# Patient Record
Sex: Female | Born: 1955
Health system: Southern US, Community
[De-identification: ages and names within clinical notes are randomized; demographics above are authoritative.]

## PROBLEM LIST (undated history)

## (undated) DIAGNOSIS — M8580 Other specified disorders of bone density and structure, unspecified site: Secondary | ICD-10-CM

## (undated) DIAGNOSIS — F329 Major depressive disorder, single episode, unspecified: Secondary | ICD-10-CM

## (undated) DIAGNOSIS — E7800 Pure hypercholesterolemia, unspecified: Secondary | ICD-10-CM

## (undated) DIAGNOSIS — I10 Essential (primary) hypertension: Secondary | ICD-10-CM

## (undated) DIAGNOSIS — Z9889 Other specified postprocedural states: Secondary | ICD-10-CM

## (undated) DIAGNOSIS — F419 Anxiety disorder, unspecified: Secondary | ICD-10-CM

## (undated) DIAGNOSIS — M1990 Unspecified osteoarthritis, unspecified site: Secondary | ICD-10-CM

## (undated) DIAGNOSIS — M419 Scoliosis, unspecified: Secondary | ICD-10-CM

## (undated) DIAGNOSIS — F32A Depression, unspecified: Secondary | ICD-10-CM

## (undated) DIAGNOSIS — E039 Hypothyroidism, unspecified: Secondary | ICD-10-CM

## (undated) DIAGNOSIS — R112 Nausea with vomiting, unspecified: Secondary | ICD-10-CM

---

## 1991-08-29 HISTORY — PX: KNEE ARTHROSCOPY: SUR90

## 1997-08-28 HISTORY — PX: TUBAL LIGATION: SHX77

## 1997-08-28 HISTORY — PX: BREAST ENHANCEMENT SURGERY: SHX7

## 1997-08-28 HISTORY — PX: OOPHORECTOMY: SHX86

## 1997-08-28 HISTORY — PX: AUGMENTATION MAMMAPLASTY: SUR837

## 1998-08-28 HISTORY — PX: TOTAL HIP ARTHROPLASTY: SHX124

## 1998-10-21 ENCOUNTER — Other Ambulatory Visit: Admission: RE | Admit: 1998-10-21 | Discharge: 1998-10-21 | Payer: Self-pay | Admitting: Obstetrics & Gynecology

## 1999-06-29 ENCOUNTER — Encounter: Payer: Self-pay | Admitting: Orthopedic Surgery

## 1999-07-06 ENCOUNTER — Inpatient Hospital Stay (HOSPITAL_COMMUNITY): Admission: RE | Admit: 1999-07-06 | Discharge: 1999-07-09 | Payer: Self-pay | Admitting: Orthopedic Surgery

## 1999-07-06 ENCOUNTER — Encounter: Payer: Self-pay | Admitting: Orthopedic Surgery

## 1999-11-07 ENCOUNTER — Other Ambulatory Visit: Admission: RE | Admit: 1999-11-07 | Discharge: 1999-11-07 | Payer: Self-pay | Admitting: Obstetrics & Gynecology

## 2000-12-12 ENCOUNTER — Other Ambulatory Visit: Admission: RE | Admit: 2000-12-12 | Discharge: 2000-12-12 | Payer: Self-pay | Admitting: Obstetrics & Gynecology

## 2001-12-23 ENCOUNTER — Other Ambulatory Visit: Admission: RE | Admit: 2001-12-23 | Discharge: 2001-12-23 | Payer: Self-pay | Admitting: Obstetrics & Gynecology

## 2002-12-26 ENCOUNTER — Other Ambulatory Visit: Admission: RE | Admit: 2002-12-26 | Discharge: 2002-12-26 | Payer: Self-pay | Admitting: Obstetrics & Gynecology

## 2003-12-14 ENCOUNTER — Other Ambulatory Visit: Admission: RE | Admit: 2003-12-14 | Discharge: 2003-12-14 | Payer: Self-pay | Admitting: Obstetrics & Gynecology

## 2004-08-28 HISTORY — PX: ABDOMINOPLASTY: SUR9

## 2008-01-23 ENCOUNTER — Encounter: Admission: RE | Admit: 2008-01-23 | Discharge: 2008-01-23 | Payer: Self-pay | Admitting: Orthopedic Surgery

## 2008-04-25 ENCOUNTER — Inpatient Hospital Stay (HOSPITAL_COMMUNITY): Admission: AC | Admit: 2008-04-25 | Discharge: 2008-04-28 | Payer: Self-pay

## 2008-04-26 ENCOUNTER — Encounter (INDEPENDENT_AMBULATORY_CARE_PROVIDER_SITE_OTHER): Payer: Self-pay | Admitting: General Surgery

## 2008-08-28 HISTORY — PX: TOTAL HIP ARTHROPLASTY: SHX124

## 2008-09-24 ENCOUNTER — Encounter: Admission: RE | Admit: 2008-09-24 | Discharge: 2008-09-24 | Payer: Self-pay | Admitting: Orthopedic Surgery

## 2009-01-06 ENCOUNTER — Encounter: Admission: RE | Admit: 2009-01-06 | Discharge: 2009-01-06 | Payer: Self-pay | Admitting: Orthopedic Surgery

## 2009-05-07 ENCOUNTER — Inpatient Hospital Stay (HOSPITAL_COMMUNITY): Admission: RE | Admit: 2009-05-07 | Discharge: 2009-05-10 | Payer: Self-pay | Admitting: Orthopedic Surgery

## 2010-08-28 HISTORY — PX: TOTAL SHOULDER ARTHROPLASTY: SHX126

## 2010-12-02 LAB — CBC
HCT: 21 % — ABNORMAL LOW (ref 36.0–46.0)
HCT: 23.2 % — ABNORMAL LOW (ref 36.0–46.0)
HCT: 27.9 % — ABNORMAL LOW (ref 36.0–46.0)
HCT: 40.3 % (ref 36.0–46.0)
Hemoglobin: 13.8 g/dL (ref 12.0–15.0)
Hemoglobin: 7.5 g/dL — CL (ref 12.0–15.0)
Hemoglobin: 8.5 g/dL — ABNORMAL LOW (ref 12.0–15.0)
Hemoglobin: 9.8 g/dL — ABNORMAL LOW (ref 12.0–15.0)
MCHC: 35.1 g/dL (ref 30.0–36.0)
MCHC: 35.6 g/dL (ref 30.0–36.0)
MCV: 100 fL (ref 78.0–100.0)
MCV: 99.7 fL (ref 78.0–100.0)
RBC: 2.34 MIL/uL — ABNORMAL LOW (ref 3.87–5.11)
RBC: 2.8 MIL/uL — ABNORMAL LOW (ref 3.87–5.11)
RBC: 4.03 MIL/uL (ref 3.87–5.11)
RDW: 12.8 % (ref 11.5–15.5)
RDW: 13 % (ref 11.5–15.5)
WBC: 4 10*3/uL (ref 4.0–10.5)
WBC: 7.3 10*3/uL (ref 4.0–10.5)

## 2010-12-02 LAB — CARDIAC PANEL(CRET KIN+CKTOT+MB+TROPI)
Relative Index: 0.8 (ref 0.0–2.5)
Total CK: 266 U/L — ABNORMAL HIGH (ref 7–177)

## 2010-12-02 LAB — TYPE AND SCREEN: Antibody Screen: NEGATIVE

## 2010-12-02 LAB — COMPREHENSIVE METABOLIC PANEL
AST: 31 U/L (ref 0–37)
BUN: 18 mg/dL (ref 6–23)
CO2: 32 mEq/L (ref 19–32)
Chloride: 102 mEq/L (ref 96–112)
Creatinine, Ser: 0.96 mg/dL (ref 0.4–1.2)
GFR calc non Af Amer: >60 mL/min (ref >60)
Total Bilirubin: 0.5 mg/dL (ref 0.3–1.2)

## 2010-12-02 LAB — URINALYSIS, ROUTINE W REFLEX MICROSCOPIC
Bilirubin Urine: NEGATIVE
Hgb urine dipstick: NEGATIVE
Specific Gravity, Urine: 1.018 (ref 1.005–1.030)
Urobilinogen, UA: 0.2 mg/dL (ref 0.0–1.0)

## 2010-12-02 LAB — BASIC METABOLIC PANEL
BUN: 9 mg/dL (ref 6–23)
CO2: 29 mEq/L (ref 19–32)
CO2: 30 mEq/L (ref 19–32)
Chloride: 100 mEq/L (ref 96–112)
Chloride: 101 mEq/L (ref 96–112)
Creatinine, Ser: 0.7 mg/dL (ref 0.4–1.2)
GFR calc Af Amer: >60 mL/min (ref >60)
Glucose, Bld: 119 mg/dL — ABNORMAL HIGH (ref 70–99)
Potassium: 3.5 mEq/L (ref 3.5–5.1)
Potassium: 4.5 mEq/L (ref 3.5–5.1)
Potassium: 4.7 mEq/L (ref 3.5–5.1)
Sodium: 137 mEq/L (ref 135–145)
Sodium: 138 mEq/L (ref 135–145)

## 2010-12-02 LAB — APTT: aPTT: 43 seconds — ABNORMAL HIGH (ref 24–37)
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
## 2011-04-20 ENCOUNTER — Other Ambulatory Visit (HOSPITAL_COMMUNITY): Payer: Self-pay

## 2011-04-25 ENCOUNTER — Other Ambulatory Visit (HOSPITAL_COMMUNITY): Payer: BC Managed Care – PPO

## 2011-05-04 ENCOUNTER — Ambulatory Visit (HOSPITAL_COMMUNITY)
Admission: RE | Admit: 2011-05-04 | Payer: BC Managed Care – PPO | Source: Ambulatory Visit | Admitting: Orthopedic Surgery

## 2011-05-08 ENCOUNTER — Other Ambulatory Visit: Payer: Self-pay | Admitting: Gastroenterology

## 2011-05-08 DIAGNOSIS — R1013 Epigastric pain: Secondary | ICD-10-CM

## 2011-05-09 ENCOUNTER — Other Ambulatory Visit (HOSPITAL_COMMUNITY): Payer: BC Managed Care – PPO

## 2011-05-11 ENCOUNTER — Ambulatory Visit
Admission: RE | Admit: 2011-05-11 | Discharge: 2011-05-11 | Disposition: A | Discharge status: Home / Self Care | Payer: BC Managed Care – PPO | Source: Ambulatory Visit | Attending: Gastroenterology | Admitting: Gastroenterology

## 2011-05-11 DIAGNOSIS — R1013 Epigastric pain: Secondary | ICD-10-CM

## 2011-05-12 ENCOUNTER — Other Ambulatory Visit: Payer: BC Managed Care – PPO

## 2011-05-12 ENCOUNTER — Encounter (HOSPITAL_BASED_OUTPATIENT_CLINIC_OR_DEPARTMENT_OTHER)
Admission: RE | Admit: 2011-05-12 | Discharge: 2011-05-12 | Disposition: A | Discharge status: Home / Self Care | Payer: BC Managed Care – PPO | Source: Ambulatory Visit | Attending: Orthopedic Surgery | Admitting: Orthopedic Surgery

## 2011-05-12 LAB — BASIC METABOLIC PANEL
CO2: 30 mEq/L (ref 19–32)
GFR calc non Af Amer: >60 mL/min (ref >60)
Glucose, Bld: 129 mg/dL — ABNORMAL HIGH (ref 70–99)
Potassium: 4 mEq/L (ref 3.5–5.1)
Sodium: 141 mEq/L (ref 135–145)

## 2011-05-16 ENCOUNTER — Inpatient Hospital Stay (HOSPITAL_COMMUNITY)
Admission: RE | Admit: 2011-05-16 | Payer: BC Managed Care – PPO | Source: Ambulatory Visit | Admitting: Orthopedic Surgery

## 2011-05-16 ENCOUNTER — Ambulatory Visit (HOSPITAL_BASED_OUTPATIENT_CLINIC_OR_DEPARTMENT_OTHER)
Admission: RE | Admit: 2011-05-16 | Discharge: 2011-05-17 | Disposition: A | Discharge status: Home / Self Care | Payer: BC Managed Care – PPO | Source: Ambulatory Visit | Attending: Orthopedic Surgery | Admitting: Orthopedic Surgery

## 2011-05-16 DIAGNOSIS — M66329 Spontaneous rupture of flexor tendons, unspecified upper arm: Secondary | ICD-10-CM | POA: Insufficient documentation

## 2011-05-16 DIAGNOSIS — Z01812 Encounter for preprocedural laboratory examination: Secondary | ICD-10-CM | POA: Insufficient documentation

## 2011-05-16 DIAGNOSIS — M24019 Loose body in unspecified shoulder: Secondary | ICD-10-CM | POA: Insufficient documentation

## 2011-05-16 DIAGNOSIS — M19019 Primary osteoarthritis, unspecified shoulder: Secondary | ICD-10-CM | POA: Insufficient documentation

## 2011-05-16 LAB — POCT HEMOGLOBIN-HEMACUE: Hemoglobin: 13.8 g/dL (ref 12.0–15.0)

## 2011-05-17 LAB — CBC
MCV: 97.2 fL (ref 78.0–100.0)
Platelets: 284 10*3/uL (ref 150–400)
RBC: 3.52 MIL/uL — ABNORMAL LOW (ref 3.87–5.11)
RDW: 12.2 % (ref 11.5–15.5)
WBC: 9 10*3/uL (ref 4.0–10.5)

## 2011-05-18 NOTE — Op Note (Signed)
NAMEMarland Kitchen  PERRIN, EDDLEMAN               ACCOUNT NO.:  [id number]  MEDICAL RECORD NO.:  [id number]  LOCATION:                                 FACILITY:  PHYSICIAN:  Katy Fitch. Raziya Aveni, M.D. DATE OF BIRTH:  23-Jul-1956  DATE OF PROCEDURE:  05/16/2011 DATE OF DISCHARGE:                              OPERATIVE REPORT   PREOPERATIVE DIAGNOSES:  Severely painful end-stage glenohumeral degenerative arthritis right shoulder with bone-on-bone arthropathy and profound flattening and teardrop osteophyte deformity of humeral head and multiple loose bodies within glenohumeral joint capsule, also significant tendinopathy of long head of biceps.  OPERATION: 1. Right shoulder glenohumeral implant arthroplasty utilizing a Biomet     comprehensive 7-mm mini stem Press-Fit, a 17 x 44 mm offset head     with the 4 position anteriorly and a 4-mm keeled inset glenoid     cemented. 2. Tenodesis (subpectoral) long head right biceps.  OPERATING SURGEON:  Katy Fitch. Hadlee Burback, MD  ASSISTANT:  Marveen Reeks Dasnoit, PA-C  ANESTHESIA:  General endotracheal supplemented by a right plexus block.  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  She presented for evaluation of her right shoulder in June 2012.  She reported that she has had severe bilateral shoulder arthritis and bilateral hip arthritis and is status post bilateral total hip arthroplasty by Dr. Lequita Halt.  She saw Dr. Darrelyn Hillock for consult regarding her shoulder and was advised to pursue a therapy rehabilitation program.  She sought an alternative opinion with our office and upon examination was noted have marked restriction of range of motion, elevation, external rotation, and could not reach behind her back.  She had night pain that limited her ability to sleep.  She could not dress herself comfortably.  She was having difficulty trying to  exercise due to significant bilateral shoulder pain.  Plain x-rays of her shoulder and an MRI from [facility] documented profound bone-on-bone arthropathy of the glenohumeral joint, flattening the humeral head, development of anterior, inferior, and posterior marginal osteophytes, and complete loss of the normal architecture of the glenoid.  There was, however, an excellent glenoid vault and ample bone stock for a glenoid component.  I had a detailed informed consent with Paula Potts in which I stated that at her age of 55 she would be at risk for developing possible loosening of her glenoid as she likely had another 30-year lifespan and we discussed the entire spectrum of potential complications of implant arthroplasty, the most worrisome including infection, loosening of the glenoid component, metal intolerance, possible neurovascular injury during surgery, possible axillary nerve weakness due to retraction, and the possibility that she might require late repair of rotator cuff or other shoulder treatment.  The benefits would include pain relief, improved range of motion, improved ability for self-care, and hopefully improved ability to sleep, although she may need to address her left shoulder prior to obtaining sleep comfort.  We exhaustively discussed the risks of infection.  We discussed providing [facility].  Paula Potts was adamant that she would prefer  to have surgery at the [facility] as she understood that the rate of possible nosocomial infection was lower per reports of the [facility].  We have had extensive experience providing shoulder arthroplasty and revision at the [facility] during the past several years and have a very experienced surgical team.  After informed consent, she is scheduled for implant arthroplasty of her right shoulder at this time.  Preoperatively,  she was interviewed by Dr. Gypsy Balsam.  She reported that she was quite intolerant of narcotics, specifically morphine and codeine. She stated that she had refractory nausea and vomiting after provision of narcotics for hip surgeries.  Dr. Gypsy Balsam provided a small dose of fentanyl in preparation for placement of a plexus block preoperatively. Paula Potts had refractory vomiting and retching after exposure to the narcotic which required IV steroids and IV Zofran to break.  We offered postponing surgery to another date, however, she was resolute in that she wanted to proceed at this time and  had dealt with this predicament previously with other elective surgeries.  She did state that postoperatively she would be able to tolerate Percocet.  Dr. Gypsy Balsam placed a Marcaine block for maximum comfort.  PROCEDURE:  Paula Potts was brought to room #2 at the [facility] and placed in supine position on the operating table.  Following anesthesia and informed consent by Dr. Gypsy Balsam, general anesthesia by an endotracheal technique was induced followed by careful position into beach-chair position with aide of a torso and head holder designed for shoulder arthroscopy.  Examination of the shoulder under anesthesia revealed combined elevation 110 degrees, forward flexion 110 degrees, external rotation at 90 degrees, abduction of 30 degrees, and external rotation at neutral of only 15 degrees.  She had marked humeral head deformity and marked stiffness of the shoulder.  The entire right upper extremity and forequarter were prepped with DuraPrep and draped with impervious arthroscopy drapes.  Passive compression devices were applied for deep vein thrombosis protection on her calves and a Foley catheter was placed.  After a routine surgical time-out, the procedure commenced with a 12-cm incision from the clavicle across the lateral aspect of the coracoid to the axillary fold.  Subcutaneous tissues  were carefully divided taking care to identify the deltopectoral interval.  The cephalic vein was identified.  A large venous plexus to the deltoid was suture ligated and the cephalic vein was retracted medially.  The clavipectoral fascia was identified and marked deformity of the humeral head palpated through the capsule.  The long head of the biceps was identified and intertubercular ligament was released with scissors followed by anatomic tenodesis of the long head of the biceps to the pectoralis major with multiple grasping sutures of #2 FiberWire.  The stump of the biceps was resected followed by release of the rotator interval and meticulous elevation of the subscap off the lesser tuberosity with a sharp osteotome and micro- tenotomy scissors.  Due to the marked deformity of the humeral head, we carefully palpated the inferior capsule and meticulously over 45 minutes resected the marginal osteophytes off the humeral head so as to avoid possible traction on the axillary nerve with vigorous retraction in the axillary region.  All of our dissection was performed intracapsularly.  The inferior  circumflex vessels were identified and electrocauterized prior to capsular release.  Once the head was properly contoured with the aid of the Biomet jigs, we placed the reamer in the intermaxillary canal and utilized the jigs to obtain a  proper 45-degree angle resection with a 35- degree retroversion.  Once this was completed, there was still some osteophyte posteriorly.  Therefore, we used the oscillating saw with freehand technique to remove slightly more head clearing the osteophytes.  A meticulous debridement of the capsule revealed multiple loose bodies inferiorly and several adherent marginal osteophytes in the inferior capsule.  Once again, care was taken to protect the axillary nerve throughout dissection.  The humeral head was retracted with a Fukuda retractor followed by exposure of  the glenoid by synovectomy, release of the capsular ligaments with cutting cautery, resection of the biceps stump, and placement of a Cobra retractor anteriorly and a Fukuda retractor posteriorly.  The glenoid was eburnated with areas of ivory-type bone and some blistering of the marginal remnants of cartilage.  We trialed the smallest 4-mm polyethylene glenoid and found that we would have a very fine rim of bone with an inset technique.  Subsequently, with the aid of a power bur and a meticulous dissection over 30 minutes, we machined the glenoid to accept a 4 mm at an inset of approximately 3 mm and cut the keel space with the power reamer and finished it with the hand rasp.  Multiple cement holes were drilled peripherally with the bur followed by trial inset of the glenoid component.  We were able to seat this flush preserving all the capsular ligaments.  We subsequently prepared cement, changed gloves, meticulously irrigated the glenoid, and with the cement in a semiliquid state, compressed cement into the keel space as well as the negative space in the glenoid and press fit the glenoid component 4 mm small into the glenoid removing excess cement. Very satisfactory inset was achieved with the polyethylene no more than 1 mm proud of the native glenoid bone rim.  The bone was quite thin anteriorly and posteriorly due to her diminutive size, however, excellent inset was achieved with an intact soft tissue envelope.  The capsular ligamentous structures should be anatomic postoperatively.  After thorough irrigation and waiting 15 minutes for the cement to set, we then prepared the proximal humerus with sequential broaching to 7 mm trialing with a 44 x 70 mm head ultimately choosing offset head long axis posterior.  After changing gloves the second time with no-touch technique, we placed the permanent stem 7-mm mini stem, tamped it in place with proper version, irrigated the reverse Morse  taper, cleaned it, and placed a 44 x 17 mm offset head with a #4 position anteriorly and the long axis posteriorly.  Anatomic coverage of the head was achieved.  The joint was reduced followed by anatomic repair of the subscapularis and rotator interval with through-bone sutures of #2 FiberWire and a finishing baseball stitch of #2 FiberWire.  The wound was meticulously irrigated followed by placement of a medium Hemovac drain brought out through a stab wound to the lateral brachium. The subcutaneous tissues were repaired with 0 Vicryl.  The skin was repaired with subcutaneous 3-0 Vicryl and intradermal 3-0 Prolene with Steri-Strips.  Total blood loss was approximately 100 mL.  Fluid replacement, see anesthesia sheet.  Paula Potts tolerated the surgery and anesthesia well.  She was placed in a sling and transferred to the recovery room with stable  vital signs.  Note that she will be [facility] for a 24-hour observation and if we have difficulties with hydration to her vomiting may admit her to the hospital for further IV hydration on a p.r.n. basis.  Katy Fitch Jaquilla Woodroof, M.D.     RVS/MEDQ  D:  05/16/2011  T:  05/16/2011  Job:  [id number]  cc:   Gretta Arab. Valentina Lucks, M.D.  Electronically Signed by Josephine Igo M.D. on 05/18/2011 11:46:05 AM

## 2011-06-07 ENCOUNTER — Emergency Department (HOSPITAL_COMMUNITY)
Admission: EM | Admit: 2011-06-07 | Discharge: 2011-06-07 | Disposition: A | Discharge status: Home / Self Care | Payer: BC Managed Care – PPO | Attending: Emergency Medicine | Admitting: Emergency Medicine

## 2011-06-07 DIAGNOSIS — Z79899 Other long term (current) drug therapy: Secondary | ICD-10-CM | POA: Insufficient documentation

## 2011-06-07 DIAGNOSIS — M25539 Pain in unspecified wrist: Secondary | ICD-10-CM | POA: Insufficient documentation

## 2011-06-07 DIAGNOSIS — I10 Essential (primary) hypertension: Secondary | ICD-10-CM | POA: Insufficient documentation

## 2011-06-07 DIAGNOSIS — S61509A Unspecified open wound of unspecified wrist, initial encounter: Secondary | ICD-10-CM | POA: Insufficient documentation

## 2011-06-07 DIAGNOSIS — Y92009 Unspecified place in unspecified non-institutional (private) residence as the place of occurrence of the external cause: Secondary | ICD-10-CM | POA: Insufficient documentation

## 2011-06-07 DIAGNOSIS — W268XXA Contact with other sharp object(s), not elsewhere classified, initial encounter: Secondary | ICD-10-CM | POA: Insufficient documentation

## 2011-06-07 DIAGNOSIS — M1990 Unspecified osteoarthritis, unspecified site: Secondary | ICD-10-CM | POA: Insufficient documentation

## 2011-06-14 ENCOUNTER — Emergency Department (HOSPITAL_COMMUNITY)
Admission: EM | Admit: 2011-06-14 | Discharge: 2011-06-14 | Disposition: A | Discharge status: Home / Self Care | Payer: BC Managed Care – PPO | Attending: Emergency Medicine | Admitting: Emergency Medicine

## 2011-06-14 DIAGNOSIS — Z4802 Encounter for removal of sutures: Secondary | ICD-10-CM | POA: Insufficient documentation

## 2012-04-04 ENCOUNTER — Other Ambulatory Visit: Payer: Self-pay

## 2012-05-07 ENCOUNTER — Other Ambulatory Visit: Payer: Self-pay | Admitting: Orthopedic Surgery

## 2012-05-07 ENCOUNTER — Encounter (HOSPITAL_BASED_OUTPATIENT_CLINIC_OR_DEPARTMENT_OTHER): Payer: Self-pay | Admitting: *Deleted
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
## 2012-05-10 ENCOUNTER — Encounter (HOSPITAL_BASED_OUTPATIENT_CLINIC_OR_DEPARTMENT_OTHER)
Admission: RE | Admit: 2012-05-10 | Discharge: 2012-05-10 | Disposition: A | Discharge status: Home / Self Care | Payer: BC Managed Care – PPO | Source: Ambulatory Visit | Attending: Orthopedic Surgery | Admitting: Orthopedic Surgery

## 2012-05-10 LAB — BASIC METABOLIC PANEL
CO2: 27 mEq/L (ref 19–32)
Chloride: 101 mEq/L (ref 96–112)
Creatinine, Ser: 0.8 mg/dL (ref 0.50–1.10)
GFR calc Af Amer: >90 mL/min (ref >90)
Potassium: 4.3 mEq/L (ref 3.5–5.1)
Sodium: 138 mEq/L (ref 135–145)

## 2012-05-14 ENCOUNTER — Ambulatory Visit (HOSPITAL_BASED_OUTPATIENT_CLINIC_OR_DEPARTMENT_OTHER): Payer: BC Managed Care – PPO | Admitting: Certified Registered Nurse Anesthetist

## 2012-05-14 ENCOUNTER — Encounter (HOSPITAL_BASED_OUTPATIENT_CLINIC_OR_DEPARTMENT_OTHER): Payer: Self-pay | Admitting: Certified Registered Nurse Anesthetist

## 2012-05-14 ENCOUNTER — Encounter (HOSPITAL_BASED_OUTPATIENT_CLINIC_OR_DEPARTMENT_OTHER): Payer: Self-pay

## 2012-05-14 ENCOUNTER — Ambulatory Visit (HOSPITAL_BASED_OUTPATIENT_CLINIC_OR_DEPARTMENT_OTHER)
Admission: RE | Admit: 2012-05-14 | Discharge: 2012-05-15 | Disposition: A | Discharge status: Home / Self Care | Payer: BC Managed Care – PPO | Source: Ambulatory Visit | Attending: Orthopedic Surgery | Admitting: Orthopedic Surgery

## 2012-05-14 ENCOUNTER — Encounter (HOSPITAL_BASED_OUTPATIENT_CLINIC_OR_DEPARTMENT_OTHER)
Admission: RE | Disposition: A | Discharge status: Home / Self Care | Payer: Self-pay | Source: Ambulatory Visit | Attending: Orthopedic Surgery

## 2012-05-14 DIAGNOSIS — M19019 Primary osteoarthritis, unspecified shoulder: Secondary | ICD-10-CM | POA: Insufficient documentation

## 2012-05-14 DIAGNOSIS — I10 Essential (primary) hypertension: Secondary | ICD-10-CM | POA: Insufficient documentation

## 2012-05-14 DIAGNOSIS — Z01812 Encounter for preprocedural laboratory examination: Secondary | ICD-10-CM | POA: Insufficient documentation

## 2012-05-14 DIAGNOSIS — Z01810 Encounter for preprocedural cardiovascular examination: Secondary | ICD-10-CM | POA: Insufficient documentation

## 2012-05-14 HISTORY — PX: TOTAL SHOULDER ARTHROPLASTY: SHX126

## 2012-05-14 HISTORY — DX: Essential (primary) hypertension: I10

## 2012-05-14 HISTORY — DX: Other specified postprocedural states: Z98.890

## 2012-05-14 HISTORY — DX: Unspecified osteoarthritis, unspecified site: M19.90

## 2012-05-14 HISTORY — DX: Scoliosis, unspecified: M41.9

## 2012-05-14 HISTORY — DX: Other specified postprocedural states: R11.2

## 2012-05-14 SURGERY — ARTHROPLASTY, SHOULDER, TOTAL
Anesthesia: General | Site: Shoulder | Laterality: Left | Wound class: Clean

## 2012-05-14 MED ORDER — MIDAZOLAM HCL 2 MG/2ML IJ SOLN
0.5000 mg | INTRAMUSCULAR | Status: DC | PRN
  Administered 2012-05-14: 2 mg via INTRAVENOUS

## 2012-05-14 MED ORDER — MIDAZOLAM HCL 5 MG/5ML IJ SOLN
INTRAMUSCULAR | Status: DC | PRN
  Administered 2012-05-14: 1 mg via INTRAVENOUS

## 2012-05-14 MED ORDER — CHLORHEXIDINE GLUCONATE 4 % EX LIQD: 60.0000 mL | Freq: Once | CUTANEOUS | Status: DC

## 2012-05-14 MED ORDER — VANCOMYCIN HCL 1000 MG IV SOLR
1000.0000 mg | INTRAVENOUS | Status: DC | PRN
  Administered 2012-05-14: 1000 mg via INTRAVENOUS

## 2012-05-14 MED ORDER — EPHEDRINE SULFATE 50 MG/ML IJ SOLN
INTRAMUSCULAR | Status: DC | PRN
  Administered 2012-05-14: 10 mg via INTRAVENOUS

## 2012-05-14 MED ORDER — ONDANSETRON HCL 4 MG/2ML IJ SOLN
4.0000 mg | Freq: Four times a day (QID) | INTRAMUSCULAR | Status: DC | PRN
  Administered 2012-05-15: 4 mg via INTRAVENOUS

## 2012-05-14 MED ORDER — ONDANSETRON HCL 4 MG/2ML IJ SOLN
INTRAMUSCULAR | Status: DC | PRN
  Administered 2012-05-14 (×2): 4 mg via INTRAVENOUS

## 2012-05-14 MED ORDER — CEFAZOLIN SODIUM-DEXTROSE 2-3 GM-% IV SOLR: 2.0000 g | Freq: Four times a day (QID) | INTRAVENOUS | Status: DC

## 2012-05-14 MED ORDER — GLYCOPYRROLATE 0.2 MG/ML IJ SOLN
INTRAMUSCULAR | Status: DC | PRN
  Administered 2012-05-14: 0.2 mg via INTRAVENOUS

## 2012-05-14 MED ORDER — LIDOCAINE HCL (CARDIAC) 20 MG/ML IV SOLN
INTRAVENOUS | Status: DC | PRN
  Administered 2012-05-14: 50 mg via INTRAVENOUS

## 2012-05-14 MED ORDER — METHOCARBAMOL 500 MG PO TABS
500.0000 mg | ORAL_TABLET | Freq: Three times a day (TID) | ORAL | Status: DC | PRN
  Administered 2012-05-14 – 2012-05-15 (×3): 500 mg via ORAL

## 2012-05-14 MED ORDER — SUCCINYLCHOLINE CHLORIDE 20 MG/ML IJ SOLN
INTRAMUSCULAR | Status: DC | PRN
  Administered 2012-05-14: 100 mg via INTRAVENOUS

## 2012-05-14 MED ORDER — HYDROMORPHONE HCL 2 MG PO TABS
2.0000 mg | ORAL_TABLET | ORAL | Status: DC | PRN
  Administered 2012-05-14 – 2012-05-15 (×3): 2 mg via ORAL

## 2012-05-14 MED ORDER — FENTANYL CITRATE 0.05 MG/ML IJ SOLN: 50.0000 ug | INTRAMUSCULAR | Status: DC | PRN

## 2012-05-14 MED ORDER — SODIUM CHLORIDE 0.9 % IV SOLN
INTRAVENOUS | Status: DC
  Administered 2012-05-14: 20 mL/h via INTRAVENOUS

## 2012-05-14 MED ORDER — PROPOFOL 10 MG/ML IV BOLUS
INTRAVENOUS | Status: DC | PRN
  Administered 2012-05-14: 200 mg via INTRAVENOUS

## 2012-05-14 MED ORDER — METOCLOPRAMIDE HCL 5 MG/ML IJ SOLN: 5.0000 mg | Freq: Three times a day (TID) | INTRAMUSCULAR | Status: DC | PRN

## 2012-05-14 MED ORDER — SCOPOLAMINE 1 MG/3DAYS TD PT72
1.0000 | MEDICATED_PATCH | TRANSDERMAL | Status: DC
  Administered 2012-05-14: 1.5 mg via TRANSDERMAL

## 2012-05-14 MED ORDER — ACETAMINOPHEN 10 MG/ML IV SOLN
1000.0000 mg | Freq: Once | INTRAVENOUS | Status: AC
  Administered 2012-05-14: 1000 mg via INTRAVENOUS

## 2012-05-14 MED ORDER — PROMETHAZINE HCL 25 MG/ML IJ SOLN
6.2500 mg | Freq: Once | INTRAMUSCULAR | Status: AC
  Administered 2012-05-14: 6.25 mg via INTRAVENOUS

## 2012-05-14 MED ORDER — HYDROMORPHONE HCL PF 1 MG/ML IJ SOLN
0.5000 mg | INTRAMUSCULAR | Status: DC | PRN
  Administered 2012-05-14 – 2012-05-15 (×4): 1 mg via INTRAVENOUS

## 2012-05-14 MED ORDER — HYDROMORPHONE HCL PF 1 MG/ML IJ SOLN: 0.2500 mg | INTRAMUSCULAR | Status: DC | PRN

## 2012-05-14 MED ORDER — ONDANSETRON HCL 4 MG PO TABS: 4.0000 mg | ORAL_TABLET | Freq: Four times a day (QID) | ORAL | Status: DC | PRN

## 2012-05-14 MED ORDER — PROMETHAZINE HCL 25 MG/ML IJ SOLN
12.5000 mg | Freq: Four times a day (QID) | INTRAMUSCULAR | Status: DC | PRN
  Administered 2012-05-14 – 2012-05-15 (×2): 12.5 mg via INTRAVENOUS

## 2012-05-14 MED ORDER — METOCLOPRAMIDE HCL 5 MG PO TABS: 5.0000 mg | ORAL_TABLET | Freq: Three times a day (TID) | ORAL | Status: DC | PRN

## 2012-05-14 MED ORDER — CEFAZOLIN SODIUM-DEXTROSE 2-3 GM-% IV SOLR
2.0000 g | Freq: Four times a day (QID) | INTRAVENOUS | Status: AC
  Administered 2012-05-14 (×2): 2 g via INTRAVENOUS

## 2012-05-14 MED ORDER — LACTATED RINGERS IV SOLN
INTRAVENOUS | Status: DC
  Administered 2012-05-14 (×3): via INTRAVENOUS

## 2012-05-14 MED ORDER — DEXAMETHASONE SODIUM PHOSPHATE 4 MG/ML IJ SOLN
INTRAMUSCULAR | Status: DC | PRN
  Administered 2012-05-14: 10 mg via INTRAVENOUS

## 2012-05-14 MED ORDER — ROPIVACAINE HCL 5 MG/ML IJ SOLN
INTRAMUSCULAR | Status: DC | PRN
  Administered 2012-05-14: 30 mL via EPIDURAL

## 2012-05-14 MED ORDER — CEFAZOLIN SODIUM-DEXTROSE 2-3 GM-% IV SOLR
2.0000 g | Freq: Once | INTRAVENOUS | Status: AC
  Administered 2012-05-14: 2 g via INTRAVENOUS

## 2012-05-14 SURGICAL SUPPLY — 87 items
APPLIER CLIP 11 MED OPEN (CLIP)
BLADE HEX COATED 2.75 (ELECTRODE) ×2 IMPLANT
BLADE SAW SAG 73X25 THK (BLADE) ×1
BLADE SAW SGTL 73X25 THK (BLADE) ×1 IMPLANT
BLADE SURG 15 STRL LF DISP TIS (BLADE) ×2 IMPLANT
BLADE SURG 15 STRL SS (BLADE) ×2
BOWL SMART MIX CTS (DISPOSABLE) ×2 IMPLANT
BUR STRYKR EGG 5.0 (BURR) ×2 IMPLANT
BUR SURG 4X8 MED (BURR) ×1 IMPLANT
BURR SURG 4X8 MED (BURR) ×2
CANISTER SUCTION 2500CC (MISCELLANEOUS) ×2 IMPLANT
CEMENT BONE SIMPLEX SPEEDSET (Cement) ×2 IMPLANT
CLEANER CAUTERY TIP 5X5 PAD (MISCELLANEOUS) IMPLANT
CLIP APPLIE 11 MED OPEN (CLIP) IMPLANT
CLOTH BEACON ORANGE TIMEOUT ST (SAFETY) ×2 IMPLANT
CORDS BIPOLAR (ELECTRODE) IMPLANT
COVER MAYO STAND STRL (DRAPES) ×4 IMPLANT
COVER TABLE BACK 60X90 (DRAPES) ×2 IMPLANT
DECANTER SPIKE VIAL GLASS SM (MISCELLANEOUS) IMPLANT
DRAPE INCISE IOBAN 66X45 STRL (DRAPES) ×2 IMPLANT
DRAPE SURG 17X23 STRL (DRAPES) ×2 IMPLANT
DRAPE U-SHAPE 47X51 STRL (DRAPES) ×2 IMPLANT
DRAPE U-SHAPE 76X120 STRL (DRAPES) ×4 IMPLANT
DRAPE UTILITY W/TAPE 26X15 (DRAPES) ×6 IMPLANT
DRSG PAD ABDOMINAL 8X10 ST (GAUZE/BANDAGES/DRESSINGS) ×2 IMPLANT
DURAPREP 26ML APPLICATOR (WOUND CARE) ×2 IMPLANT
ELECT NEEDLE TIP 2.8 STRL (NEEDLE) IMPLANT
ELECT REM PT RETURN 9FT ADLT (ELECTROSURGICAL) ×2
ELECTRODE REM PT RTRN 9FT ADLT (ELECTROSURGICAL) ×1 IMPLANT
EVACUATOR 1/8 PVC DRAIN (DRAIN) ×2 IMPLANT
EVACUATOR 3/16  PVC DRAIN (DRAIN)
EVACUATOR 3/16 PVC DRAIN (DRAIN) IMPLANT
GLENOID BIO-MOD KEELED SM 4MM (Orthopedic Implant) ×2 IMPLANT
GLOVE BIO SURGEON STRL SZ 6.5 (GLOVE) ×6 IMPLANT
GLOVE BIOGEL M STRL SZ7.5 (GLOVE) ×4 IMPLANT
GLOVE BIOGEL PI IND STRL 7.0 (GLOVE) ×1 IMPLANT
GLOVE BIOGEL PI IND STRL 8 (GLOVE) ×2 IMPLANT
GLOVE BIOGEL PI INDICATOR 7.0 (GLOVE) ×1
GLOVE BIOGEL PI INDICATOR 8 (GLOVE) ×2
GLOVE ORTHO TXT STRL SZ7.5 (GLOVE) ×10 IMPLANT
GOWN BRE IMP PREV XXLGXLNG (GOWN DISPOSABLE) ×4 IMPLANT
GOWN PREVENTION PLUS XLARGE (GOWN DISPOSABLE) ×2 IMPLANT
HEAD OFFSET 44X17 (Head) ×2 IMPLANT
LOOP VESSEL MAXI BLUE (MISCELLANEOUS) IMPLANT
NDL SUT 6 .5 CRC .975X.05 MAYO (NEEDLE) IMPLANT
NEEDLE MAYO TAPER (NEEDLE)
NS IRRIG 1000ML POUR BTL (IV SOLUTION) ×2 IMPLANT
PACK ARTHROSCOPY DSU (CUSTOM PROCEDURE TRAY) ×2 IMPLANT
PACK BASIN DAY SURGERY FS (CUSTOM PROCEDURE TRAY) ×2 IMPLANT
PAD CLEANER CAUTERY TIP 5X5 (MISCELLANEOUS)
PENCIL BUTTON HOLSTER BLD 10FT (ELECTRODE) ×2 IMPLANT
PIN STEINMANN THREADED TIP (PIN) ×2 IMPLANT
RETRIEVER SUT HEWSON (MISCELLANEOUS) IMPLANT
SLEEVE SCD COMPRESS KNEE MED (MISCELLANEOUS) ×2 IMPLANT
SLING ARM FOAM STRAP LRG (SOFTGOODS) ×2 IMPLANT
SLING ARM FOAM STRAP MED (SOFTGOODS) IMPLANT
SPONGE GAUZE 4X4 12PLY (GAUZE/BANDAGES/DRESSINGS) ×2 IMPLANT
SPONGE LAP 18X18 X RAY DECT (DISPOSABLE) ×4 IMPLANT
STRIP CLOSURE SKIN 1/2X4 (GAUZE/BANDAGES/DRESSINGS) ×2 IMPLANT
SUCTION FRAZIER TIP 10 FR DISP (SUCTIONS) ×2 IMPLANT
SUT ETHIBOND 2 OS 4 DA (SUTURE) IMPLANT
SUT ETHILON 4 0 PS 2 18 (SUTURE) IMPLANT
SUT FIBERWIRE #2 38 T-5 BLUE (SUTURE) ×12
SUT FIBERWIRE 3-0 18 TAPR NDL (SUTURE)
SUT PROLENE 3 0 PS 2 (SUTURE) ×2 IMPLANT
SUT SILK 3 0 TIES 17X18 (SUTURE)
SUT SILK 3-0 18XBRD TIE BLK (SUTURE) IMPLANT
SUT SILK 4 0 TIES 17X18 (SUTURE) IMPLANT
SUT VIC AB 0 CT1 27 (SUTURE) ×2
SUT VIC AB 0 CT1 27XBRD ANBCTR (SUTURE) ×2 IMPLANT
SUT VIC AB 0 SH 27 (SUTURE) IMPLANT
SUT VIC AB 2-0 SH 27 (SUTURE) ×1
SUT VIC AB 2-0 SH 27XBRD (SUTURE) ×1 IMPLANT
SUT VIC AB 3-0 SH 27 (SUTURE) ×1
SUT VIC AB 3-0 SH 27X BRD (SUTURE) ×1 IMPLANT
SUT VIC AB 3-0 X1 27 (SUTURE) IMPLANT
SUTURE FIBERWR #2 38 T-5 BLUE (SUTURE) ×6 IMPLANT
SUTURE FIBERWR 3-0 18 TAPR NDL (SUTURE) IMPLANT
SYR 3ML 23GX1 SAFETY (SYRINGE) IMPLANT
SYR BULB 3OZ (MISCELLANEOUS) IMPLANT
SYR BULB IRRIGATION 50ML (SYRINGE) ×2 IMPLANT
SYR CONTROL 10ML LL (SYRINGE) ×4 IMPLANT
Standard 9mm Shoulder Stem (Stem) ×2 IMPLANT
TAPE PAPER 3X10 WHT MICROPORE (GAUZE/BANDAGES/DRESSINGS) ×2 IMPLANT
TRAY FOLEY CATH 14FR (SET/KITS/TRAYS/PACK) IMPLANT
WATER STERILE IRR 1000ML POUR (IV SOLUTION) ×2 IMPLANT
YANKAUER SUCT BULB TIP NO VENT (SUCTIONS) ×2 IMPLANT

## 2012-05-14 NOTE — Transfer of Care (Signed)
Immediate Anesthesia Transfer of Care Note  Patient: Paula Potts  Procedure(s) Performed: Procedure(s) (LRB) with comments: TOTAL SHOULDER ARTHROPLASTY (Left) - Biceps Tenodesis also  Patient Location: PACU  Anesthesia Type: General and Regional  Level of Consciousness: awake, alert , oriented and patient cooperative  Airway & Oxygen Therapy: Patient Spontanous Breathing and Patient connected to face mask oxygen  Post-op Assessment: Report given to PACU RN and Post -op Vital signs reviewed and stable  Post vital signs: Reviewed and stable  Complications: No apparent anesthesia complications

## 2012-05-14 NOTE — Anesthesia Postprocedure Evaluation (Signed)
  Anesthesia Post-op Note  Patient: Paula Potts  Procedure(s) Performed: Procedure(s) (LRB) with comments: TOTAL SHOULDER ARTHROPLASTY (Left) - Biceps Tenodesis also  Patient Location: PACU  Anesthesia Type: GA combined with regional for post-op pain  Level of Consciousness: awake  Airway and Oxygen Therapy: Patient Spontanous Breathing  Post-op Pain: none  Post-op Assessment: Post-op Vital signs reviewed, Patient's Cardiovascular Status Stable, Respiratory Function Stable and Patent Airway  Post-op Vital Signs: Reviewed and stable  Complications: No apparent anesthesia complications

## 2012-05-14 NOTE — Brief Op Note (Signed)
05/14/2012  10:56 AM  PATIENT:  Paula Potts  56 y.o. female  PRE-OPERATIVE DIAGNOSIS:  end stage osteoarthritis degenerative joint disease left shoulder gleno-humeral joint  POST-OPERATIVE DIAGNOSIS:  end stage osteoarthritis degenerative joint disease left shoulder gleno-humeral joint  PROCEDURE:  Procedure(s) (LRB) with comments: TOTAL SHOULDER ARTHROPLASTY (Left) - Biceps Tenodesis also  SURGEON:  Surgeon(s) and Role:    * Wyn Forster., MD - Primary  PHYSICIAN ASSISTANT:   ASSISTANTS: Mallory Shirk.A-C   ANESTHESIA:   general  EBL:  Total I/O In: 2000 [I.V.:2000] Out: -   BLOOD ADMINISTERED:none  DRAINS: Medium hemovac to bulb suction  LOCAL MEDICATIONS USED:  ropivicaine plexus block pre op  SPECIMEN:  Excision  DISPOSITION OF SPECIMEN:  PATHOLOGY  COUNTS:  YES  TOURNIQUET:  * No tourniquets in log *  DICTATION: .Other Dictation: Dictation Number [id number]  PLAN OF CARE: Admit for overnight observation  PATIENT DISPOSITION:  PACU - hemodynamically stable.

## 2012-05-14 NOTE — Op Note (Signed)
[id number] 

## 2012-05-14 NOTE — H&P (Signed)
Paula Potts is an 56 y.o. female.   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 She has had a history of severe bilateral hip arthrosis and is s/p bilateral total hip arthroplasty by Ollen Gross, right 2000 and left 2010. She has right ankle arthrosis, a hallux valgus of the right great toe and multiple hammer toes.   Paula Potts has had progressive discomfort in her left shoulder, loss of ROM, and inability to sleep on the left shoulder at night. She has difficulty performing self care and hair care due to her shoulder discomfort. She underwent surgery to the right shoulder as an outpatient in 2012 and did very well in the post-op period. She now desires surgical intervention to the left side.  Past Medical History  Diagnosis Date  . PONV (postoperative nausea and vomiting)   . Hypertension   . Arthritis   . DJD (degenerative joint disease)   . Osteoporosis   . Scoliosis     mild lower back  . Osteoarthritis   . Contact lens/glasses fitting     Past Surgical History  Procedure Date  . Total hip arthroplasty 2000    right  . Total hip arthroplasty 2010    left  . Cosmetic surgery 2006    tummy tuck  . Breast enhancement surgery 99  . Knee arthroscopy 1993    left  . Total shoulder arthroplasty 2012    right-dsc    History reviewed. No pertinent family history. Social History:  reports that she has never smoked. She does not have any smokeless tobacco history on file. She reports that she drinks alcohol. She reports that she does not use illicit drugs.  Allergies:  Allergies  Allergen Reactions  . Codeine Nausea And Vomiting  . Hydrocodone Nausea And Vomiting  . Morphine And Related Nausea And Vomiting  . Oxycodone Nausea And Vomiting    Medications Prior to Admission  Medication Sig Dispense Refill  . calcium carbonate  (OS-CAL) 600 MG TABS Take 600 mg by mouth daily. Takes 2      . cholecalciferol (VITAMIN D) 1000 UNITS tablet Take 1,000 Units by mouth daily.      Marland Kitchen gabapentin (NEURONTIN) 300 MG capsule Take 300 mg by mouth at bedtime.      Marland Kitchen lisinopril (PRINIVIL,ZESTRIL) 20 MG tablet Take 20 mg by mouth daily.      . naproxen sodium (ANAPROX) 220 MG tablet Take 220 mg by mouth 2 (two) times daily with a meal.        No results found for this or any previous visit (from the past 48 hour(s)).  No results found.   Pertinent items are noted in HPI.  Blood pressure 136/88, pulse 67, temperature 98.5 F (36.9 C), temperature source Oral, resp. rate 20, height 5\' 2"  (1.575 m), weight 54.432 kg (120 lb), SpO2 100.00%.  General appearance: alert Head: Normocephalic, without obvious abnormality Neck: supple, symmetrical, trachea midline Resp: clear to auscultation bilaterally Cardio: regular rate and rhythm GI: normal findings: bowel sounds normal Extremities:Her shoulder ROM reveals elevation 175, abduction 170, external rotation 75 and internal rotation T9 on the left. She has full ROM of her elbow, forearm, wrist and fingers.   Her plain films document bone on bone arthropathy at the glenohumeral joint with large tear drop osteophyte. She has mild AC arthrosis.   Pulses: 2+  and symmetric Skin: normal Neurologic: Grossly normal    Assessment/Plan Impression: End stage OA DJD left shoulder glenohumeral joint  Plan:To the OR for left total shoulder arthroplasty.Paula Potts has advanced osteoarthritis with flattening of the humeral head and significant osteophyte formation. She has pain with any activities of daily living including dressing, sleeping on the left side and any overhead activity above 90 degrees abduction. The procedure, risks,benefits and post-op course were discussed with the patient at length and she was in agreement with the plan.   DASNOIT,Jazline Cumbee J 05/14/2012, 7:02 AM     H&P  documentation: 05/14/2012  -History and Physical Reviewed  -Patient has been re-examined  -No change in the plan of care  Wyn Forster, MD

## 2012-05-14 NOTE — Anesthesia Procedure Notes (Addendum)
Anesthesia Regional Block:  Interscalene brachial plexus block  Pre-Anesthetic Checklist: ,, timeout performed, Correct Patient, Correct Site, Correct Laterality, Correct Procedure, Correct Position, site marked, Risks and benefits discussed, pre-op evaluation,  At surgeon's request and post-op pain management  Laterality: Left  Prep: Maximum Sterile Barrier Precautions used and chloraprep       Needles:  Injection technique: Single-shot  Needle Type: Echogenic Stimulator Needle      Needle Gauge: 22 and 22 G    Additional Needles:  Procedures: ultrasound guided and nerve stimulator Interscalene brachial plexus block  Nerve Stimulator or Paresthesia:  Response: Biceps response, 0.4 mA,   Additional Responses:   Narrative:  Start time: 05/14/2012 7:07 AM End time: 05/14/2012 7:13 AM Injection made incrementally with aspirations every 5 mL. Anesthesiologist: Sampson Goon, MD  Additional Notes: 2% Lidocaine skin wheel.   Interscalene brachial plexus block Procedure Name: Intubation Date/Time: 05/14/2012 7:47 AM Performed by: Heidie Krall D Pre-anesthesia Checklist: Patient identified, Emergency Drugs available, Suction available and Patient being monitored Patient Re-evaluated:Patient Re-evaluated prior to inductionOxygen Delivery Method: Circle System Utilized Preoxygenation: Pre-oxygenation with 100% oxygen Intubation Type: IV induction Ventilation: Mask ventilation without difficulty Laryngoscope Size: Mac and 3 Grade View: Grade I Tube type: Oral Number of attempts: 1 Airway Equipment and Method: stylet and LTA kit utilized Placement Confirmation: ETT inserted through vocal cords under direct vision,  positive ETCO2 and breath sounds checked- equal and bilateral Secured at: 21 cm Tube secured with: Tape Dental Injury: Teeth and Oropharynx as per pre-operative assessment

## 2012-05-14 NOTE — Anesthesia Preprocedure Evaluation (Signed)
Anesthesia Evaluation  Patient identified by MRN, date of birth, ID band Patient awake    Reviewed: Allergy & Precautions, H&P , NPO status , Patient's Chart, lab work & pertinent test results  History of Anesthesia Complications (+) PONV  Airway Mallampati: II TM Distance: >3 FB Neck ROM: Full    Dental No notable dental hx. (+) Teeth Intact and Dental Advisory Given   Pulmonary neg pulmonary ROS,  breath sounds clear to auscultation  Pulmonary exam normal       Cardiovascular hypertension, On Medications negative cardio ROS  Rhythm:Regular Rate:Normal     Neuro/Psych negative neurological ROS  negative psych ROS   GI/Hepatic negative GI ROS, Neg liver ROS,   Endo/Other  negative endocrine ROS  Renal/GU negative Renal ROS  negative genitourinary   Musculoskeletal   Abdominal   Peds  Hematology negative hematology ROS (+)   Anesthesia Other Findings   Reproductive/Obstetrics negative OB ROS                           Anesthesia Physical Anesthesia Plan  ASA: II  Anesthesia Plan: General   Post-op Pain Management:    Induction: Intravenous  Airway Management Planned: Oral ETT  Additional Equipment:   Intra-op Plan:   Post-operative Plan: Extubation in OR  Informed Consent: I have reviewed the patients History and Physical, chart, labs and discussed the procedure including the risks, benefits and alternatives for the proposed anesthesia with the patient or authorized representative who has indicated his/her understanding and acceptance.   Dental advisory given  Plan Discussed with: CRNA  Anesthesia Plan Comments:         Anesthesia Quick Evaluation

## 2012-05-14 NOTE — Progress Notes (Signed)
Pt c/o nausea when taking pain medication. Refused pain medication until offered Phenergan with pain medication to prevent nausea.

## 2012-05-14 NOTE — Progress Notes (Signed)
Assisted Dr. Fitzgerald with left, ultrasound guided, interscalene  block. Side rails up, monitors on throughout procedure. See vital signs in flow sheet. Tolerated Procedure well. 

## 2012-05-15 ENCOUNTER — Encounter (HOSPITAL_BASED_OUTPATIENT_CLINIC_OR_DEPARTMENT_OTHER): Payer: Self-pay | Admitting: Orthopedic Surgery

## 2012-05-15 NOTE — Progress Notes (Signed)
Subjective: 1 Day Post-Op Procedure(s) (LRB): TOTAL SHOULDER ARTHROPLASTY (Left) Patient reports pain as moderate after block has worn off. No nausea during past six hours.   Objective: Vital signs in last 24 hours: Temp:  [97.3 F (36.3 C)-98.1 F (36.7 C)] 97.7 F (36.5 C) (09/18 0630) Pulse Rate:  [58-118] 70  (09/18 0652) Resp:  [13-24] 18  (09/18 0652) BP: (112-171)/(62-88) 127/65 mmHg (09/18 0630) SpO2:  [93 %-100 %] 100 % (09/18 0652)  Intake/Output from previous day: 09/17 0701 - 09/18 0700 In: 4035 [P.O.:1716; I.Potts.:2219; IV Piggyback:100] Out: 1620 [Urine:1500; Drains:120] Intake/Output this shift:     Basename 05/14/12 0655  HGB 13.0   No results found for this basename: WBC:2,RBC:2,HCT:2,PLT:2 in the last 72 hours No results found for this basename: NA:2,K:2,CL:2,CO2:2,BUN:2,CREATININE:2,GLUCOSE:2,CALCIUM:2 in the last 72 hours No results found for this basename: LABPT:2,INR:2 in the last 72 hours  Dressing removed.  Wound benign. Drain removed and Tegaderm dressing applied. Deltoid motor function intact and axillary sensation is intact. Assessment/Plan: 1 Day Post-Op Procedure(s) (LRB): TOTAL SHOULDER ARTHROPLASTY (Left) Stable post op.  Paula Potts wants to go home and has friends visiting to help with ADLs.  Will see her in the office in 48 hours for wound check and start of rehab.  Paula Potts,Paula Potts 05/15/2012, 8:06 AM

## 2012-05-15 NOTE — Op Note (Signed)
NAME:  Paula Potts, Paula Potts NO.:  [id number]  MEDICAL RECORD NO.:  [id number]  LOCATION:                                 FACILITY:  PHYSICIAN:  Katy Fitch. Jaselyn Nahm, M.D. DATE OF BIRTH:  1955/12/08  DATE OF PROCEDURE:  05/14/2012 DATE OF DISCHARGE:                              OPERATIVE REPORT   PREOPERATIVE DIAGNOSIS:  End-stage glenohumeral degenerative arthritis of left shoulder with profound pain and impairment of activities of daily living and sleeping.  POSTOPERATIVE DIAGNOSIS:  End-stage glenohumeral degenerative arthritis of left shoulder with profound pain and impairment of activities of daily living and sleeping.  OPERATION: 1. Biomet comprehensive total shoulder arthroplasty utilizing a     standard 9 mm stem and a 44 x 17 offset head 2. Biceps tenodesis.  OPERATING SURGEON:  Katy Fitch. Danialle Dement, MD  ASSISTANT:  Marveen Reeks Dasnoit, PA-C  ANESTHESIA:  General by endotracheal technique supplemented by a left ropivacaine plexus block.  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  She has had bilateral total hip replacements and is status post a prior right total shoulder replacement.  She has orthopedic issues in her feet and knees.  She now returns requesting a left total shoulder arthroplasty.  Preoperative films demonstrate a large teardrop osteophyte extending from 3 o'clock anteriorly to 9 o'clock posteriorly.  That has lengthened her humeral head by more than 50%.  She has marked eburnation of the bone at the base of her glenoid.  Emerlyn and I had very detailed informed consent prior to right total shoulder arthroplasty as well as prior to this, left total shoulder arthroplasty.  She understands the primary risks of arthroplasty or failure to relieve all her pain, possible neurovascular  injury, and possible infection of the components.  She understands that should she have infection, she would require revision surgery and prolonged antibiotics are often more than 1 year.  She would likely need infectious disease consult and could have permanent impairment.  She also understands that the possibility of neurovascular injury always exists with implant arthroplasty, although we will do our very best to care for her safely.  After detailed informed consent in the office and in the holding area, she was brought to the operating room at this time.  Preoperatively, she was administered 2 g of Ancef as an IV prophylactic antibiotic per protocol and 1 g of IV vancomycin in the OR as our components were placed.  PROCEDURE:  Arayla Sazama was interviewed by Dr. Sampson Goon and after detailed anesthesia and informed consent, had a ropivacaine plexus block placed in the holding area leading to excellent anesthesia of the left arm and forequarter.  Paula Potts was transferred to room 6 of the [facility] and placed in supine position upon the operating table.  Following the induction of general anesthesia by endotracheal technique under Dr. Jarrett Ables direct supervision, Paula Potts was carefully positioned in the beach chair position with the aid of a torso and head holder designed for shoulder arthroscopy.  Impervious arthroscopy drapes were applied exposing the shoulder.  After routine surgical time-out, the procedure commenced with a 12 cm incision from the clavicle just medial to the coracoid to the upper aspect to the pectoralis major tendon insertion.  Subcutaneous tissues were carefully divided taking care to identify the cephalic vein which was retracted medially.  The clavicle pectoral fascia was identified and the subdeltoid bursa elevated.  A Hohmann retractor was placed to expose the humeral head followed by identification of the anterior-inferior circumflex  vessels.  The anterior-inferior circumflex vessels were suture ligated with 0 Vicryl followed by careful palpation of the axillary nerve.  Paula Potts had a very long complex osteophyte along the neck of her humerus.  We spent nearly half an hour meticulously peeling the capsule back followed by use of a joker to protect the axillary nerve followed by piecemeal removal of the osteophyte and we identified the anatomic neck of her humerus.  We subsequently identified the long head of the biceps, released it at the upper aspect of the bicipital groove, tenodesed the biceps to the falciform ligament of the pectoralis major, and performed a release of the rotator interval and excision of the intra-articular portion of long head of the biceps.  We then meticulously tenolysed the subscapularis and performed a partial capsulectomy at the anterior- inferior capsule.  Great care was to continue to palpate the axillary nerve, which was directly subjacent to the large osteophyte.  After the osteophyte excision was removed, the humeral head was delivered with the posterior osteophytes removed.  With the aid of the Biomet cutting jig, we resected the humeral head at a 135 degree angle, taking care to measure twice and cut once.  A very satisfactory resection was achieved.  The head measured 44 x 17 mm as it did on the right.  A Fukuda retractor was placed posterior to the glenoid and with great care, further capsulectomy was accomplished followed by removal of the labrum and placement of multiple fluted retractors around the glenoid exposing the glenoid.  We then performed an inset glenoid technique utilizing 2 power burs, 1 with a rather sharp edge and 1 with a rather blunt edge to create an inset of the polyethylene 4 mm component, 3 mm into the glenoid.  After careful lavage with sterile saline and using a hand rasp to cut the fin receptacle, we undermined the glenoid vault with curved  curette's.  After thorough lavage with sterile saline, we then obtained semi liquid cement that was packed carefully into the glenoid followed by compression of the glenoid component into the glenoid utilizing continuous pressure for 15 minutes.  All excess cement was removed.  A very satisfactory inset glenoid was accomplished with at least 2-3 mm of the glenoid surrounded by subchondral bone.  After thorough irrigation and removal of all excess cement, we then delivered the humeral head.  We reamed the canal first with a pilot reamer followed by reaming to 9 mm at the isthmus.  We decided to use a standard stem due to some issues in the past with concern about subsidence of the mini stents.  We then placed the stem with standard no- touch technique, changing gloves prior to implantation of each component.  We then tried both a neutral and a offset 44 x 17 humeral head, ultimately selecting the offset with the long axis at about 40 degrees posterior to cephalad.  This was tamped into place after proper cleaning of the reverse Morse taper.  The joint was reduced and had very satisfactory 50% translation,  external rotation of at least 60 degrees and elevation to at least 150 degrees.  The joint was thoroughly lavaged and hemostasis assured.  We then meticulously repaired the subscapularis with 3 mattress sutures of #2 FiberWire to the neck of the humerus.  We repaired the rotator interval with a running baseball stitch of #2 FiberWire with knots buried and reinforced repair with the course of the falciform ligament covering the sutures.  We then placed a medium Hemovac drain, closed the rotator interval with interrupted suture of 2-0 Vicryl and meticulously repaired the skin with subcutaneous 2-0 Vicryl and intradermal 3-0 Prolene.  For aftercare, Paula Potts was placed in a sling, awakened from general anesthesia and transferred to the recovery room with stable signs.  We will  admit her to the recovery care center for observation of vital signs and appropriate analgesics in the form of p.o. and IV Dilaudid and Ancef 1 g IV q.6 hours x3 doses.     Katy Fitch Nicoles Sedlacek, M.D.     RVS/MEDQ  D:  05/14/2012  T:  05/15/2012  Job:  [id number]

## 2012-05-16 ENCOUNTER — Inpatient Hospital Stay: Admit: 2012-05-16 | Payer: Self-pay | Admitting: Orthopedic Surgery

## 2012-05-16 SURGERY — ARTHROPLASTY, ANKLE, TOTAL
Anesthesia: General | Laterality: Right

## 2012-05-22 DIAGNOSIS — Z23 Encounter for immunization: Secondary | ICD-10-CM

## 2012-11-11 ENCOUNTER — Encounter (HOSPITAL_COMMUNITY): Payer: Self-pay | Admitting: Pharmacy Technician

## 2012-11-12 ENCOUNTER — Encounter (HOSPITAL_COMMUNITY)
Admission: RE | Admit: 2012-11-12 | Discharge: 2012-11-12 | Disposition: A | Discharge status: Home / Self Care | Payer: BC Managed Care – PPO | Source: Ambulatory Visit | Attending: Anesthesiology | Admitting: Anesthesiology

## 2012-11-12 ENCOUNTER — Encounter (HOSPITAL_COMMUNITY): Payer: Self-pay

## 2012-11-12 ENCOUNTER — Encounter (HOSPITAL_COMMUNITY)
Admission: RE | Admit: 2012-11-12 | Discharge: 2012-11-12 | Disposition: A | Discharge status: Home / Self Care | Payer: BC Managed Care – PPO | Source: Ambulatory Visit | Attending: Orthopedic Surgery | Admitting: Orthopedic Surgery

## 2012-11-12 HISTORY — DX: Depression, unspecified: F32.A

## 2012-11-12 HISTORY — DX: Anxiety disorder, unspecified: F41.9

## 2012-11-12 HISTORY — DX: Major depressive disorder, single episode, unspecified: F32.9

## 2012-11-12 LAB — ABO/RH: ABO/RH(D): O POS

## 2012-11-12 LAB — CBC
MCH: 34.9 pg — ABNORMAL HIGH (ref 26.0–34.0)
MCHC: 35.6 g/dL (ref 30.0–36.0)
MCV: 98.1 fL (ref 78.0–100.0)
Platelets: 305 10*3/uL (ref 150–400)
RBC: 4.15 MIL/uL (ref 3.87–5.11)

## 2012-11-12 LAB — BASIC METABOLIC PANEL
BUN: 22 mg/dL (ref 6–23)
CO2: 27 mEq/L (ref 19–32)
Calcium: 9.7 mg/dL (ref 8.4–10.5)
GFR calc non Af Amer: 79 mL/min — ABNORMAL LOW (ref >90)
Glucose, Bld: 87 mg/dL (ref 70–99)

## 2012-11-12 LAB — TYPE AND SCREEN: ABO/RH(D): O POS

## 2012-11-12 NOTE — Progress Notes (Signed)
Dr.Hewitt's office called for orders. 

## 2012-11-12 NOTE — Pre-Procedure Instructions (Signed)
Paula Potts  11/12/2012   Your procedure is scheduled on: 11-14-2012  Report to [facility] at 10:15  AM. Take the [facility] to the 3rd floor  Call this number if you have problems the morning of surgery: [phone]   Remember:   Do not eat food or drink liquids after midnight.   Take these medicines the morning of surgery with A SIP OF WATER: none   Do not wear jewelry, make-up or nail polish.  Do not wear lotions, powders, or perfumes. You may wear deodorant.  Do not shave 48 hours prior to surgery.   Do not bring valuables to the hospital.  Contacts, dentures or bridgework may not be worn into surgery.  Leave suitcase in the car. After surgery it may be brought to your room.   For patients admitted to the hospital, checkout time is 11:00 AM the day of discharge.   Patients discharged the day of surgery will not be allowed to drive home.    Special Instructions: Shower using CHG 2 nights before surgery and the night before surgery.  If you shower the day of surgery use CHG.  Use special wash - you have one bottle of CHG for all showers.  You should use approximately 1/3 of the bottle for each shower.   Please read over the following fact sheets that you were given: Pain Booklet, Coughing and Deep Breathing, Blood Transfusion Information and Surgical Site Infection Prevention

## 2012-11-14 ENCOUNTER — Encounter (HOSPITAL_COMMUNITY): Payer: Self-pay | Admitting: Certified Registered"

## 2012-11-14 ENCOUNTER — Ambulatory Visit (HOSPITAL_COMMUNITY): Payer: BC Managed Care – PPO | Admitting: Certified Registered"

## 2012-11-14 ENCOUNTER — Observation Stay (HOSPITAL_COMMUNITY)
Admission: RE | Admit: 2012-11-14 | Discharge: 2012-11-15 | Disposition: A | Discharge status: Home / Self Care | Payer: BC Managed Care – PPO | Source: Ambulatory Visit | Attending: Orthopedic Surgery | Admitting: Orthopedic Surgery

## 2012-11-14 ENCOUNTER — Encounter (HOSPITAL_COMMUNITY)
Admission: RE | Disposition: A | Discharge status: Home / Self Care | Payer: Self-pay | Source: Ambulatory Visit | Attending: Orthopedic Surgery

## 2012-11-14 ENCOUNTER — Observation Stay (HOSPITAL_COMMUNITY): Payer: BC Managed Care – PPO

## 2012-11-14 DIAGNOSIS — Z01818 Encounter for other preprocedural examination: Secondary | ICD-10-CM | POA: Insufficient documentation

## 2012-11-14 DIAGNOSIS — M6240 Contracture of muscle, unspecified site: Secondary | ICD-10-CM | POA: Insufficient documentation

## 2012-11-14 DIAGNOSIS — M19071 Primary osteoarthritis, right ankle and foot: Secondary | ICD-10-CM

## 2012-11-14 DIAGNOSIS — M19079 Primary osteoarthritis, unspecified ankle and foot: Principal | ICD-10-CM | POA: Insufficient documentation

## 2012-11-14 DIAGNOSIS — I10 Essential (primary) hypertension: Secondary | ICD-10-CM | POA: Insufficient documentation

## 2012-11-14 DIAGNOSIS — M159 Polyosteoarthritis, unspecified: Secondary | ICD-10-CM | POA: Insufficient documentation

## 2012-11-14 DIAGNOSIS — Z01812 Encounter for preprocedural laboratory examination: Secondary | ICD-10-CM | POA: Insufficient documentation

## 2012-11-14 HISTORY — PX: TOTAL ANKLE ARTHROPLASTY: SHX811

## 2012-11-14 HISTORY — PX: GASTROCNEMIUS RECESSION: SHX863

## 2012-11-14 LAB — CBC
Hemoglobin: 13.2 g/dL (ref 12.0–15.0)
MCH: 34.1 pg — ABNORMAL HIGH (ref 26.0–34.0)
MCV: 96.9 fL (ref 78.0–100.0)
Platelets: 267 10*3/uL (ref 150–400)
RBC: 3.87 MIL/uL (ref 3.87–5.11)
WBC: 10.8 10*3/uL — ABNORMAL HIGH (ref 4.0–10.5)

## 2012-11-14 LAB — CREATININE, SERUM
Creatinine, Ser: 0.68 mg/dL (ref 0.50–1.10)
GFR calc Af Amer: >90 mL/min (ref >90)

## 2012-11-14 SURGERY — ARTHROPLASTY, ANKLE, TOTAL
Anesthesia: General | Site: Ankle | Laterality: Right | Wound class: Clean

## 2012-11-14 MED ORDER — ROCURONIUM BROMIDE 100 MG/10ML IV SOLN
INTRAVENOUS | Status: DC | PRN
  Administered 2012-11-14: 10 mg via INTRAVENOUS
  Administered 2012-11-14: 30 mg via INTRAVENOUS

## 2012-11-14 MED ORDER — PROMETHAZINE HCL 25 MG/ML IJ SOLN
INTRAMUSCULAR | Status: AC
  Filled 2012-11-14: qty 1

## 2012-11-14 MED ORDER — SODIUM CHLORIDE 0.9 % IV SOLN
INTRAVENOUS | Status: DC
  Administered 2012-11-14 – 2012-11-15 (×2): via INTRAVENOUS

## 2012-11-14 MED ORDER — CHLORHEXIDINE GLUCONATE 4 % EX LIQD: 60.0000 mL | Freq: Once | CUTANEOUS | Status: DC

## 2012-11-14 MED ORDER — HYDROMORPHONE HCL 2 MG PO TABS
2.0000 mg | ORAL_TABLET | ORAL | Status: DC | PRN
  Administered 2012-11-15 (×2): 4 mg via ORAL
  Filled 2012-11-14: qty 3
  Filled 2012-11-14: qty 2

## 2012-11-14 MED ORDER — ONDANSETRON HCL 4 MG/2ML IJ SOLN
4.0000 mg | Freq: Once | INTRAMUSCULAR | Status: AC
  Administered 2012-11-14: 4 mg via INTRAVENOUS

## 2012-11-14 MED ORDER — NEOSTIGMINE METHYLSULFATE 1 MG/ML IJ SOLN
INTRAMUSCULAR | Status: DC | PRN
  Administered 2012-11-14: 4 mg via INTRAVENOUS

## 2012-11-14 MED ORDER — LIDOCAINE HCL (CARDIAC) 20 MG/ML IV SOLN
INTRAVENOUS | Status: DC | PRN
  Administered 2012-11-14: 80 mg via INTRAVENOUS

## 2012-11-14 MED ORDER — PROPOFOL 10 MG/ML IV BOLUS
INTRAVENOUS | Status: DC | PRN
  Administered 2012-11-14: 200 mg via INTRAVENOUS

## 2012-11-14 MED ORDER — CALCIUM CARBONATE 600 MG PO TABS
1200.0000 mg | ORAL_TABLET | Freq: Every day | ORAL | Status: DC
  Filled 2012-11-14 (×2): qty 2

## 2012-11-14 MED ORDER — VANCOMYCIN HCL 500 MG IV SOLR
INTRAVENOUS | Status: AC
  Filled 2012-11-14: qty 500

## 2012-11-14 MED ORDER — PROMETHAZINE HCL 25 MG/ML IJ SOLN
12.5000 mg | Freq: Four times a day (QID) | INTRAMUSCULAR | Status: DC | PRN
  Administered 2012-11-15 (×2): 12.5 mg via INTRAVENOUS
  Filled 2012-11-14 (×2): qty 1

## 2012-11-14 MED ORDER — SENNA 8.6 MG PO TABS
2.0000 | ORAL_TABLET | Freq: Two times a day (BID) | ORAL | Status: DC
  Administered 2012-11-14 – 2012-11-15 (×2): 17.2 mg via ORAL
  Filled 2012-11-14 (×3): qty 2

## 2012-11-14 MED ORDER — BUPIVACAINE-EPINEPHRINE PF 0.5-1:200000 % IJ SOLN
INTRAMUSCULAR | Status: DC | PRN
  Administered 2012-11-14: 30 mL

## 2012-11-14 MED ORDER — ONDANSETRON HCL 4 MG/2ML IJ SOLN
4.0000 mg | Freq: Four times a day (QID) | INTRAMUSCULAR | Status: DC | PRN
  Administered 2012-11-14 – 2012-11-15 (×2): 4 mg via INTRAVENOUS
  Filled 2012-11-14 (×2): qty 2

## 2012-11-14 MED ORDER — DIPHENHYDRAMINE HCL 12.5 MG/5ML PO ELIX: 12.5000 mg | ORAL_SOLUTION | ORAL | Status: DC | PRN

## 2012-11-14 MED ORDER — HYDROMORPHONE HCL PF 1 MG/ML IJ SOLN
0.5000 mg | INTRAMUSCULAR | Status: DC | PRN
  Administered 2012-11-15 (×3): 1 mg via INTRAVENOUS
  Filled 2012-11-14 (×3): qty 1

## 2012-11-14 MED ORDER — 0.9 % SODIUM CHLORIDE (POUR BTL) OPTIME
TOPICAL | Status: DC | PRN
  Administered 2012-11-14: 1000 mL

## 2012-11-14 MED ORDER — SUFENTANIL CITRATE 50 MCG/ML IV SOLN
INTRAVENOUS | Status: DC | PRN
  Administered 2012-11-14 (×3): 10 ug via INTRAVENOUS

## 2012-11-14 MED ORDER — BACITRACIN ZINC 500 UNIT/GM EX OINT
TOPICAL_OINTMENT | CUTANEOUS | Status: DC | PRN
  Administered 2012-11-14: 1 via TOPICAL

## 2012-11-14 MED ORDER — PROMETHAZINE HCL 25 MG/ML IJ SOLN
6.2500 mg | INTRAMUSCULAR | Status: DC | PRN
  Administered 2012-11-14: 6.25 mg via INTRAVENOUS

## 2012-11-14 MED ORDER — ENOXAPARIN SODIUM 40 MG/0.4ML ~~LOC~~ SOLN
40.0000 mg | SUBCUTANEOUS | Status: DC
  Administered 2012-11-15: 40 mg via SUBCUTANEOUS
  Filled 2012-11-14 (×3): qty 0.4

## 2012-11-14 MED ORDER — SODIUM CHLORIDE 0.9 % IV SOLN: INTRAVENOUS | Status: DC

## 2012-11-14 MED ORDER — ONDANSETRON HCL 4 MG PO TABS
4.0000 mg | ORAL_TABLET | Freq: Four times a day (QID) | ORAL | Status: DC | PRN
  Administered 2012-11-15: 4 mg via ORAL
  Filled 2012-11-14: qty 1

## 2012-11-14 MED ORDER — MIDAZOLAM HCL 2 MG/2ML IJ SOLN
INTRAMUSCULAR | Status: AC
  Administered 2012-11-14: 2 mg
  Filled 2012-11-14: qty 2

## 2012-11-14 MED ORDER — CEFAZOLIN SODIUM 1-5 GM-% IV SOLN
1.0000 g | Freq: Four times a day (QID) | INTRAVENOUS | Status: AC
  Administered 2012-11-14 – 2012-11-15 (×3): 1 g via INTRAVENOUS
  Filled 2012-11-14 (×3): qty 50

## 2012-11-14 MED ORDER — METHOCARBAMOL 500 MG PO TABS
500.0000 mg | ORAL_TABLET | Freq: Four times a day (QID) | ORAL | Status: DC | PRN
  Administered 2012-11-15: 500 mg via ORAL
  Filled 2012-11-14: qty 1

## 2012-11-14 MED ORDER — VITAMIN D3 25 MCG (1000 UNIT) PO TABS
1000.0000 [IU] | ORAL_TABLET | Freq: Every day | ORAL | Status: DC
  Administered 2012-11-15: 1000 [IU] via ORAL
  Filled 2012-11-14 (×2): qty 1

## 2012-11-14 MED ORDER — ONDANSETRON HCL 4 MG/2ML IJ SOLN
INTRAMUSCULAR | Status: AC
  Administered 2012-11-14: 4 mg
  Filled 2012-11-14: qty 2

## 2012-11-14 MED ORDER — VANCOMYCIN HCL 500 MG IV SOLR: INTRAVENOUS | Status: DC | PRN

## 2012-11-14 MED ORDER — FENTANYL CITRATE 0.05 MG/ML IJ SOLN
INTRAMUSCULAR | Status: AC
  Filled 2012-11-14: qty 2

## 2012-11-14 MED ORDER — GABAPENTIN 300 MG PO CAPS
300.0000 mg | ORAL_CAPSULE | Freq: Every day | ORAL | Status: DC
  Administered 2012-11-14: 300 mg via ORAL
  Filled 2012-11-14 (×2): qty 1

## 2012-11-14 MED ORDER — MIDAZOLAM HCL 5 MG/ML IJ SOLN: 2.0000 mg | Freq: Once | INTRAMUSCULAR | Status: DC

## 2012-11-14 MED ORDER — DOCUSATE SODIUM 100 MG PO CAPS
100.0000 mg | ORAL_CAPSULE | Freq: Two times a day (BID) | ORAL | Status: DC
  Administered 2012-11-14 – 2012-11-15 (×2): 100 mg via ORAL
  Filled 2012-11-14 (×3): qty 1

## 2012-11-14 MED ORDER — METHOCARBAMOL 100 MG/ML IJ SOLN
500.0000 mg | Freq: Four times a day (QID) | INTRAVENOUS | Status: DC | PRN
  Filled 2012-11-14: qty 5

## 2012-11-14 MED ORDER — ACETAMINOPHEN 10 MG/ML IV SOLN
INTRAVENOUS | Status: AC
  Filled 2012-11-14: qty 100

## 2012-11-14 MED ORDER — LISINOPRIL 20 MG PO TABS
20.0000 mg | ORAL_TABLET | Freq: Every day | ORAL | Status: DC
  Administered 2012-11-15: 20 mg via ORAL
  Filled 2012-11-14 (×2): qty 1

## 2012-11-14 MED ORDER — LACTATED RINGERS IV SOLN
INTRAVENOUS | Status: DC
  Administered 2012-11-14 (×2): via INTRAVENOUS

## 2012-11-14 MED ORDER — CEFAZOLIN SODIUM-DEXTROSE 2-3 GM-% IV SOLR
2.0000 g | INTRAVENOUS | Status: AC
  Administered 2012-11-14: 2 g via INTRAVENOUS
  Filled 2012-11-14 (×2): qty 50

## 2012-11-14 MED ORDER — DEXAMETHASONE SODIUM PHOSPHATE 10 MG/ML IJ SOLN
INTRAMUSCULAR | Status: DC | PRN
  Administered 2012-11-14: 4 mg via INTRAVENOUS

## 2012-11-14 MED ORDER — FENTANYL CITRATE 0.05 MG/ML IJ SOLN
100.0000 ug | Freq: Once | INTRAMUSCULAR | Status: AC
  Administered 2012-11-14: 75 ug via INTRAVENOUS

## 2012-11-14 MED ORDER — GLYCOPYRROLATE 0.2 MG/ML IJ SOLN
INTRAMUSCULAR | Status: DC | PRN
  Administered 2012-11-14: 0.6 mg via INTRAVENOUS

## 2012-11-14 MED ORDER — HYDROMORPHONE HCL PF 1 MG/ML IJ SOLN: 0.2500 mg | INTRAMUSCULAR | Status: DC | PRN

## 2012-11-14 MED ORDER — BACITRACIN ZINC 500 UNIT/GM EX OINT
TOPICAL_OINTMENT | CUTANEOUS | Status: AC
  Filled 2012-11-14: qty 15

## 2012-11-14 MED ORDER — ACETAMINOPHEN 10 MG/ML IV SOLN
INTRAVENOUS | Status: DC | PRN
  Administered 2012-11-14: 1000 mg via INTRAVENOUS

## 2012-11-14 MED ORDER — SUCCINYLCHOLINE CHLORIDE 20 MG/ML IJ SOLN
INTRAMUSCULAR | Status: DC | PRN
  Administered 2012-11-14: 100 mg via INTRAVENOUS

## 2012-11-14 MED ORDER — ACETAMINOPHEN 325 MG PO TABS: 650.0000 mg | ORAL_TABLET | Freq: Four times a day (QID) | ORAL | Status: DC | PRN

## 2012-11-14 MED ORDER — ONDANSETRON HCL 4 MG/2ML IJ SOLN
INTRAMUSCULAR | Status: DC | PRN
  Administered 2012-11-14: 4 mg via INTRAVENOUS

## 2012-11-14 MED ORDER — EPHEDRINE SULFATE 50 MG/ML IJ SOLN
INTRAMUSCULAR | Status: DC | PRN
  Administered 2012-11-14 (×2): 10 mg via INTRAVENOUS

## 2012-11-14 SURGICAL SUPPLY — 59 items
BANDAGE ESMARK 6X9 LF (GAUZE/BANDAGES/DRESSINGS) ×1 IMPLANT
BLADE LONG MED 31X9 (MISCELLANEOUS) ×2 IMPLANT
BLADE RECIP (BLADE) ×2 IMPLANT
BLADE RECIPRO TAPERED (BLADE) ×2 IMPLANT
BLADE SAGITTAL (BLADE) ×1
BLADE SAW (BLADE) ×2 IMPLANT
BLADE SAW THK.89X75X18XSGTL (BLADE) ×1 IMPLANT
BLADE SURG 15 STRL LF DISP TIS (BLADE) ×2 IMPLANT
BLADE SURG 15 STRL SS (BLADE) ×2
BNDG ESMARK 6X9 LF (GAUZE/BANDAGES/DRESSINGS) ×2
CHLORAPREP W/TINT 26ML (MISCELLANEOUS) ×2 IMPLANT
CLOTH BEACON ORANGE TIMEOUT ST (SAFETY) ×2 IMPLANT
CORE SLIDING STAR SZ 9MM (Orthopedic Implant) ×2 IMPLANT
COVER SURGICAL LIGHT HANDLE (MISCELLANEOUS) ×2 IMPLANT
CUFF TOURNIQUET SINGLE 34IN LL (TOURNIQUET CUFF) ×2 IMPLANT
CUFF TOURNIQUET SINGLE 44IN (TOURNIQUET CUFF) IMPLANT
DRAPE C-ARM 42X72 X-RAY (DRAPES) ×2 IMPLANT
DRAPE C-ARMOR (DRAPES) ×2 IMPLANT
DRAPE EXTREMITY T 121X128X90 (DRAPE) ×2 IMPLANT
DRAPE ORTHO SPLIT 77X108 STRL (DRAPES) ×1
DRAPE SURG ORHT 6 SPLT 77X108 (DRAPES) ×1 IMPLANT
DRAPE TABLE COVER HEAVY DUTY (DRAPES) ×2 IMPLANT
DRAPE U-SHAPE 47X51 STRL (DRAPES) ×2 IMPLANT
DRSG ADAPTIC 3X8 NADH LF (GAUZE/BANDAGES/DRESSINGS) ×2 IMPLANT
DRSG MEPILEX BORDER 4X4 (GAUZE/BANDAGES/DRESSINGS) ×2 IMPLANT
DRSG PAD ABDOMINAL 8X10 ST (GAUZE/BANDAGES/DRESSINGS) ×2 IMPLANT
ELECT REM PT RETURN 9FT ADLT (ELECTROSURGICAL) ×2
ELECTRODE REM PT RTRN 9FT ADLT (ELECTROSURGICAL) ×1 IMPLANT
EVACUATOR 1/8 PVC DRAIN (DRAIN) ×2 IMPLANT
GLOVE BIO SURGEON STRL SZ8 (GLOVE) ×4 IMPLANT
GLOVE BIOGEL PI IND STRL 6.5 (GLOVE) ×3 IMPLANT
GLOVE BIOGEL PI IND STRL 8 (GLOVE) ×1 IMPLANT
GLOVE BIOGEL PI INDICATOR 6.5 (GLOVE) ×3
GLOVE BIOGEL PI INDICATOR 8 (GLOVE) ×1
GLOVE ECLIPSE 6.5 STRL STRAW (GLOVE) ×2 IMPLANT
GLOVE ORTHO TXT STRL SZ7.5 (GLOVE) ×2 IMPLANT
GOWN PREVENTION PLUS XLARGE (GOWN DISPOSABLE) ×4 IMPLANT
GOWN STRL NON-REIN LRG LVL3 (GOWN DISPOSABLE) ×2 IMPLANT
IMPLANT TALAR STAR SZ XXS RT (Orthopedic Implant) ×2 IMPLANT
IMPLANT TIBIAL STAR SZ M (Orthopedic Implant) ×2 IMPLANT
KIT BASIN OR (CUSTOM PROCEDURE TRAY) ×2 IMPLANT
KIT ROOM TURNOVER OR (KITS) ×2 IMPLANT
MANIFOLD NEPTUNE II (INSTRUMENTS) ×2 IMPLANT
NS IRRIG 1000ML POUR BTL (IV SOLUTION) ×2 IMPLANT
PACK TOTAL JOINT (CUSTOM PROCEDURE TRAY) ×2 IMPLANT
PAD ARMBOARD 7.5X6 YLW CONV (MISCELLANEOUS) ×4 IMPLANT
PAD CAST 4YDX4 CTTN HI CHSV (CAST SUPPLIES) ×1 IMPLANT
PADDING CAST COTTON 4X4 STRL (CAST SUPPLIES) ×1
PADDING CAST COTTON 6X4 STRL (CAST SUPPLIES) ×2 IMPLANT
SPONGE GAUZE 4X4 12PLY (GAUZE/BANDAGES/DRESSINGS) ×2 IMPLANT
SUCTION FRAZIER TIP 10 FR DISP (SUCTIONS) ×2 IMPLANT
SUT ETHILON 3 0 PS 1 (SUTURE) ×2 IMPLANT
SUT MNCRL AB 3-0 PS2 18 (SUTURE) ×4 IMPLANT
SUT PROLENE 3 0 PS 2 (SUTURE) ×2 IMPLANT
SUT VIC AB 0 CT1 27 (SUTURE) ×2
SUT VIC AB 0 CT1 27XBRD ANBCTR (SUTURE) ×2 IMPLANT
TOWEL OR 17X24 6PK STRL BLUE (TOWEL DISPOSABLE) ×2 IMPLANT
TOWEL OR 17X26 10 PK STRL BLUE (TOWEL DISPOSABLE) ×2 IMPLANT
WATER STERILE IRR 1000ML POUR (IV SOLUTION) ×2 IMPLANT

## 2012-11-14 NOTE — Brief Op Note (Signed)
11/14/2012  3:29 PM  PATIENT:  Paula Potts  57 y.o. female  PRE-OPERATIVE DIAGNOSIS:  1.  Right ankle varus arthritis      2.  Right gastroc contracture  POST-OPERATIVE DIAGNOSIS:  same  Procedure(s): 1.  Right total ankle replacement 2.  Right gastrocnemius recession  SURGEON:  Toni Arthurs, MD  ASSISTANT: n/a  ANESTHESIA:   General, regional  EBL:  minimal   TOURNIQUET:   Total Tourniquet Time Documented: Thigh (Right) - 129 minutes Total: Thigh (Right) - 129 minutes   COMPLICATIONS:  None apparent  DISPOSITION:  Extubated, awake and stable to recovery.  DICTATION ID:  [id number]

## 2012-11-14 NOTE — Transfer of Care (Signed)
Immediate Anesthesia Transfer of Care Note  Patient: Paula Potts  Procedure(s) Performed: Procedure(s): TOTAL ANKLE ARTHOPLASTY (Right) GASTROCNEMIUS RECESSION (Right)  Patient Location: PACU  Anesthesia Type:General  Level of Consciousness: awake  Airway & Oxygen Therapy: Patient Spontanous Breathing and Patient connected to nasal cannula oxygen  Post-op Assessment: Report given to PACU RN, Post -op Vital signs reviewed and stable and Patient moving all extremities  Post vital signs: Reviewed and stable  Complications: No apparent anesthesia complications

## 2012-11-14 NOTE — H&P (Signed)
Paula Potts is an 57 y.o. female.   Chief Complaint: right ankle arthritis and tight heelcord HPI: 57 y/o female with PMH significant for severe osteoarthritis has a long h/o bilat ankle arthritis.  She presents now for right total ankle replacement and gastrocnemius recession.  Past Medical History  Diagnosis Date  . PONV (postoperative nausea and vomiting)   . Hypertension   . Arthritis   . DJD (degenerative joint disease)   . Osteoporosis   . Scoliosis     mild lower back  . Osteoarthritis   . Contact lens/glasses fitting   . Depression   . Anxiety     Past Surgical History  Procedure Laterality Date  . Total hip arthroplasty  2000    right  . Total hip arthroplasty  2010    left  . Cosmetic surgery  2006    tummy tuck  . Breast enhancement surgery  99  . Knee arthroscopy  1993    left  . Total shoulder arthroplasty  2012    right-dsc  . Total shoulder arthroplasty  05/14/2012    Procedure: TOTAL SHOULDER ARTHROPLASTY;  Surgeon: Wyn Forster., MD;  [facility]:  [facility];  Service: Orthopedics;  Laterality: Left;  Biceps Tenodesis also    History reviewed. No pertinent family history. Social History:  reports that she has never smoked. She does not have any smokeless tobacco history on file. She reports that  drinks alcohol. She reports that she does not use illicit drugs.  Allergies:  Allergies  Allergen Reactions  . Codeine Nausea And Vomiting  . Hydrocodone Nausea And Vomiting  . Morphine And Related Nausea And Vomiting  . Oxycodone Nausea And Vomiting    Medications Prior to Admission  Medication Sig Dispense Refill  . acetaminophen (TYLENOL) 500 MG tablet Take 500 mg by mouth every 6 (six) hours as needed for pain.      . calcium carbonate (OS-CAL) 600 MG TABS Take 1,200 mg by mouth daily.       . cholecalciferol (VITAMIN D) 1000 UNITS tablet Take 1,000 Units by mouth daily.      Marland Kitchen gabapentin (NEURONTIN) 300 MG capsule Take 300 mg by  mouth at bedtime.      Marland Kitchen lisinopril (PRINIVIL,ZESTRIL) 20 MG tablet Take 20 mg by mouth daily.        Results for orders placed during the hospital encounter of 11/12/12 (from the past 48 hour(s))  BASIC METABOLIC PANEL     Status: Abnormal   Collection Time    11/12/12  2:53 PM      Result Value Range   Sodium 141  135 - 145 mEq/L   Potassium 3.9  3.5 - 5.1 mEq/L   Chloride 101  96 - 112 mEq/L   CO2 27  19 - 32 mEq/L   Glucose, Bld 87  70 - 99 mg/dL   BUN 22  6 - 23 mg/dL   Creatinine, Ser 1.19  0.50 - 1.10 mg/dL   Calcium 9.7  8.4 - 14.7 mg/dL   GFR calc non Af Amer 79 (*) >90 mL/min   GFR calc Af Amer >90  >90 mL/min   Comment:            The eGFR has been calculated     using the CKD EPI equation.     This calculation has not been     validated in all clinical     situations.     eGFR's persistently     <  90 mL/min signify     possible Chronic Kidney Disease.  CBC     Status: Abnormal   Collection Time    11/12/12  2:53 PM      Result Value Range   WBC 6.8  4.0 - 10.5 K/uL   RBC 4.15  3.87 - 5.11 MIL/uL   Hemoglobin 14.5  12.0 - 15.0 g/dL   HCT 16.1  09.6 - 04.5 %   MCV 98.1  78.0 - 100.0 fL   MCH 34.9 (*) 26.0 - 34.0 pg   MCHC 35.6  30.0 - 36.0 g/dL   RDW 40.9  81.1 - 91.4 %   Platelets 305  150 - 400 K/uL  SURGICAL PCR SCREEN     Status: Abnormal   Collection Time    11/12/12  2:57 PM      Result Value Range   MRSA, PCR NEGATIVE  NEGATIVE   Staphylococcus aureus POSITIVE (*) NEGATIVE   Comment:            The Xpert SA Assay (FDA     approved for NASAL specimens     in patients over 47 years of age),     is one component of     a comprehensive surveillance     program.  Test performance has     been validated by [employer] for patients greater     than or equal to 43 year old.     It is not intended     to diagnose infection nor to     guide or monitor treatment.  TYPE AND SCREEN     Status: None   Collection Time    11/12/12  3:00 PM       Result Value Range   ABO/RH(D) O POS     Antibody Screen NEG     Sample Expiration 11/26/2012    ABO/RH     Status: None   Collection Time    11/12/12  3:00 PM      Result Value Range   ABO/RH(D) O POS     Dg Chest 2 View  11/12/2012  *RADIOLOGY REPORT*  Clinical Data: Preop for ankle surgery.  CHEST - 2 VIEW  Comparison: 05/10/2009.  Findings: The heart, mediastinum and hilar contours are normal. The aorta is tortuous.  The lungs are clear.  There are no effusions or pneumothoraces.  There is a mild dextroscoliosis of the mid thoracic spine.  There are bilateral humeral prosthesis.  IMPRESSION: No active disease.   Original Report Authenticated By: Sander Radon, M.D.     ROS  No recent f/cn/v/wt loss  Blood pressure 130/70, pulse 86, temperature 98.1 F (36.7 C), temperature source Oral, resp. rate 20, SpO2 99.00%. Physical Exam  wn wd woman in nad.  A and O x 4.  Mood and affect normal.  EOMI.  Respirations unlabored.  R ankle with varus alignment.  Heelcord is tight.  5/5 strength in PF and DF of the ankle.  Sens to LT intact at foot.  Skin healthy.  No lymphadenopathy.  Assessment/Plan Right ankle arthritis and tight heelcord - to OR for total ankle replacement and gastroc recession.  The risks and benefits of the alternative treatment options have been discussed in detail.  The patient wishes to proceed with surgery and specifically understands risks of bleeding, infection, nerve damage, blood clots, need for additional surgery, amputation and death.   Toni Arthurs 2012/11/25, 12:24 PM

## 2012-11-14 NOTE — Anesthesia Preprocedure Evaluation (Addendum)
Anesthesia Evaluation  Patient identified by MRN, date of birth, ID band Patient awake    History of Anesthesia Complications (+) PONV  Airway Mallampati: I  Neck ROM: Full    Dental  (+) Caps and Dental Advisory Given   Pulmonary neg pulmonary ROS,  breath sounds clear to auscultation        Cardiovascular hypertension, Rhythm:Regular Rate:Normal     Neuro/Psych Anxiety Depression negative neurological ROS     GI/Hepatic   Endo/Other    Renal/GU      Musculoskeletal  (+) Arthritis -, Osteoarthritis,    Abdominal   Peds  Hematology   Anesthesia Other Findings   Reproductive/Obstetrics                          Anesthesia Physical Anesthesia Plan  ASA: II  Anesthesia Plan: General   Post-op Pain Management:    Induction: Intravenous  Airway Management Planned: LMA  Additional Equipment:   Intra-op Plan:   Post-operative Plan: Extubation in OR  Informed Consent:   Dental advisory given  Plan Discussed with: CRNA and Surgeon  Anesthesia Plan Comments:        Anesthesia Quick Evaluation

## 2012-11-14 NOTE — Anesthesia Postprocedure Evaluation (Signed)
  Anesthesia Post-op Note  Patient: Paula Potts  Procedure(s) Performed: Procedure(s): TOTAL ANKLE ARTHOPLASTY (Right) GASTROCNEMIUS RECESSION (Right)  Patient Location: PACU  Anesthesia Type:GA combined with regional for post-op pain  Level of Consciousness: sedated, patient cooperative and responds to stimulation and voice  Airway and Oxygen Therapy: Patient Spontanous Breathing and Patient connected to nasal cannula oxygen  Post-op Pain: none  Post-op Assessment: Post-op Vital signs reviewed, Patient's Cardiovascular Status Stable, Respiratory Function Stable, RESPIRATORY FUNCTION UNSTABLE and Pain level controlled, nausea much imporved  Post-op Vital Signs: Reviewed and stable  Complications: No apparent anesthesia complications, although mild nausea persists

## 2012-11-14 NOTE — Preoperative (Signed)
Beta Blockers   Reason not to administer Beta Blockers:Not Applicable 

## 2012-11-14 NOTE — Anesthesia Procedure Notes (Addendum)
Anesthesia Regional Block:  Popliteal block  Pre-Anesthetic Checklist: ,, timeout performed, Correct Patient, Correct Site, Correct Laterality, Correct Procedure, Correct Position, site marked, Risks and benefits discussed, Surgical consent,  At surgeon's request and post-op pain management  Laterality: Right and Lower  Prep: chloraprep       Needles:   Needle Type: Echogenic Needle          Additional Needles:  Procedures: Doppler guided, ultrasound guided (picture in chart) and nerve stimulator Popliteal block Narrative:  Start time: 11/14/2012 11:35 AM End time: 11/14/2012 11:52 AM Injection made incrementally with aspirations every 5 mL.  Performed by: Personally  Anesthesiologist: T Massagee  Additional Notes: Tolerated well,   Procedure Name: Intubation Date/Time: 11/14/2012 12:43 PM Performed by: Jerilee Hoh Pre-anesthesia Checklist: Patient identified, Emergency Drugs available, Suction available and Patient being monitored Patient Re-evaluated:Patient Re-evaluated prior to inductionOxygen Delivery Method: Circle system utilized Preoxygenation: Pre-oxygenation with 100% oxygen Intubation Type: IV induction Ventilation: Mask ventilation without difficulty Laryngoscope Size: Mac and 3 Grade View: Grade II Tube type: Oral Tube size: 7.5 mm Number of attempts: 1 Airway Equipment and Method: Stylet Placement Confirmation: ETT inserted through vocal cords under direct vision,  positive ETCO2 and breath sounds checked- equal and bilateral Secured at: 21 cm Tube secured with: Tape Dental Injury: Teeth and Oropharynx as per pre-operative assessment

## 2012-11-15 ENCOUNTER — Encounter (HOSPITAL_COMMUNITY): Payer: Self-pay | Admitting: General Practice

## 2012-11-15 MED ORDER — SENNA 8.6 MG PO TABS: 2.0000 | ORAL_TABLET | Freq: Two times a day (BID) | ORAL | Status: DC

## 2012-11-15 MED ORDER — PROMETHAZINE HCL 12.5 MG PO TABS
12.5000 mg | ORAL_TABLET | Freq: Four times a day (QID) | ORAL | Status: DC | PRN
Start: 2012-11-15 — End: 2013-08-13

## 2012-11-15 MED ORDER — HYDROMORPHONE HCL 2 MG PO TABS: 2.0000 mg | ORAL_TABLET | ORAL | Status: DC | PRN

## 2012-11-15 MED ORDER — CALCIUM CARBONATE 1250 (500 CA) MG PO TABS
1.0000 | ORAL_TABLET | Freq: Every day | ORAL | Status: DC
  Filled 2012-11-15: qty 1

## 2012-11-15 MED ORDER — DSS 100 MG PO CAPS: 100.0000 mg | ORAL_CAPSULE | Freq: Two times a day (BID) | ORAL | Status: DC

## 2012-11-15 NOTE — Discharge Summary (Signed)
Physician Discharge Summary  Patient ID: Paula Potts MRN: [id number] DOB/AGE: [birth year] 57 y.o.  Admit date: 11/14/2012 Discharge date: 11/15/2012  Admission Diagnoses:  Right ankle arthritis  Discharge Diagnoses:  Right ankle arthritis s/p total ankle replacement  Discharged Condition: stable  Hospital Course: Pt was admitted on 3/20 and taken to the OR for right total ankle replacement.  Post operatively she did well with PT and is ready for discharge today.  Consults: None  Significant Diagnostic Studies: none  Treatments: surgery: as above  Discharge Exam: Blood pressure 132/59, pulse 71, temperature 98 F (36.7 C), temperature source Oral, resp. rate 18, SpO2 98.00%. wn wd female in nad.  R ankle immobilized in a splint.  Toes with brisk cap refill, normal sens to LT and  55/ strength in PF and DF.  Disposition: 01-Home or Self Care  Discharge Orders   Future Orders Complete By Expires     Call MD / Call 911  As directed     Comments:      If you experience chest pain or shortness of breath, CALL 911 and be transported to the hospital emergency room.  If you develope a fever above 101 F, pus (white drainage) or increased drainage or redness at the wound, or calf pain, call your surgeon's office.    Constipation Prevention  As directed     Comments:      Drink plenty of fluids.  Prune juice may be helpful.  You may use a stool softener, such as Colace (over the counter) 100 mg twice a day.  Use MiraLax (over the counter) for constipation as needed.    Diet - low sodium heart healthy  As directed     Increase activity slowly as tolerated  As directed         Medication List    TAKE these medications       acetaminophen 500 MG tablet  Commonly known as:  TYLENOL  Take 500 mg by mouth every 6 (six) hours as needed for pain.     calcium carbonate 600 MG Tabs  Commonly known as:  OS-CAL  Take 1,200 mg by mouth daily.     cholecalciferol 1000 UNITS tablet   Commonly known as:  VITAMIN D  Take 1,000 Units by mouth daily.     DSS 100 MG Caps  Take 100 mg by mouth 2 (two) times daily.     gabapentin 300 MG capsule  Commonly known as:  NEURONTIN  Take 300 mg by mouth at bedtime.     HYDROmorphone 2 MG tablet  Commonly known as:  DILAUDID  Take 1-2 tablets (2-4 mg total) by mouth every 4 (four) hours as needed for pain.     lisinopril 20 MG tablet  Commonly known as:  PRINIVIL,ZESTRIL  Take 20 mg by mouth daily.     promethazine 12.5 MG tablet  Commonly known as:  PHENERGAN  Take 1 tablet (12.5 mg total) by mouth every 6 (six) hours as needed for nausea.     senna 8.6 MG Tabs  Commonly known as:  SENOKOT  Take 2 tablets (17.2 mg total) by mouth 2 (two) times daily.           Follow-up Information   Follow up with Dmitry Macomber, Jonny Ruiz, MD. Schedule an appointment as soon as possible for a visit in 2 weeks.   Contact information:   [address], [address] [place] [address] [phone]       Signed:  Toni Potts 11/15/2012, 1:39 PM

## 2012-11-15 NOTE — Op Note (Signed)
NAME:  Paula Potts, Paula Potts NO.:  [id number]  MEDICAL RECORD NO.:  [id number]  LOCATION:  5N01C                        FACILITY:  [facility]  PHYSICIAN:  Toni Arthurs, MD        DATE OF BIRTH:  December 20, 1955  DATE OF PROCEDURE:  11/14/2012 DATE OF DISCHARGE:                              OPERATIVE REPORT   PREOPERATIVE DIAGNOSES: 1. Right ankle varus arthritis. 2. Right gastrocnemius contracture.  POSTOPERATIVE DIAGNOSES: 1. Right ankle varus arthritis. 2. Right gastrocnemius contracture.  PROCEDURES: 1. Right total ankle replacement. 2. Right gastrocnemius recession.  SURGEON:  Toni Arthurs, MD  ANESTHESIA:  General, regional.  ESTIMATED BLOOD LOSS:  Minimal.  TOURNIQUET TIME:  2 hours and 9 minutes at 225 mmHg.  COMPLICATIONS:  None apparent.  DISPOSITION:  Extubated, awake, and stable to recovery.  IMPLANTS:  STAR Size XXS talus, 9 mm poly and medium tibia.  INDICATIONS FOR PROCEDURE:  The patient is a 57 year old woman with past medical history significant for multi-joint osteoarthritis.  She has right varus ankle arthritis.  She has failed treatment with activity modification, oral anti-inflammatories, bracing, and steroid injections. She presents now for operative treatment of this painful right ankle arthritis.  She was also noted to have a gastrocnemius contracture.  She understands the risks and benefits, the alternative treatment options, and elects surgical treatment.  She specifically understands risks of bleeding, infection, nerve damage, blood clots, need for additional surgery, amputation, and death.  PROCEDURE IN DETAIL:  After preoperative consent was obtained and the correct operative site was identified, the patient was brought to the operating room and placed supine on the operating table.  General anesthesia was induced.  Preoperative antibiotics were administered. Surgical time-out was taken.  The right lower extremity was prepped  and draped in standard sterile fashion with a tourniquet around the thigh. The extremity was exsanguinated and then tourniquet was inflated to 225 mmHg.  A longitudinal incision was made over the medial calf.  Sharp dissection was carried down through the skin and subcutaneous tissue. The superficial fascia was incised.  The gastrocnemius tendon was identified along with the plantaris tendon.  Both were divided from medial to lateral under direct vision.  The wound was irrigated. Inverted simple sutures of 3-0 Monocryl were used to close the subcutaneous tissue and running 3-0 nylon was used to close the skin incision.  Attention was then turned to the anterior aspect of the ankle where a longitudinal incision was made over the extensor hallucis longus tendon sheath.  Sharp dissection was carried down through the subcutaneous tissue, taking care to protect the crossing branches of the superficial peroneal nerve.  The extensor retinaculum was incised over the extensor hallucis longus tendon.  It was released proximally and distally.  The interval between the tibialis anterior and the EHL was then developed, mobilizing the neurovascular bundle when retracting it laterally.  The anterior joint capsule was then elevated medially and laterally, exposing the ankle joint.  Significant anterior osteophytes were noted overhanging the joint.  There was significant cartilage loss throughout the joint as well as varus erosion into the distal tibia.  A deep release of the deep and superficial deltoid ligament  was then performed with a 15 blade.  This was carried around the entire medial malleolus. A lamina spreader was then placed into the joint line and the joint was noted to mobilize to a neutral position easily.  An oscillating saw was then used to resect the osteophytes anteriorly.  A stab incision was made at the tibial tubercle.  A 3.2-mm guide pin was inserted in line with a quarter-inch  osteotome, placed in the medial gutter.  The external alignment guide was then attached to the guide pin proximally and set to the level of the medial malleolus distally.  The alignment was then set and confirmed on AP and lateral fluoroscopic images.  The rotation was set using the quarter-inch osteotome in the medial gutter as a guide.  The guide was then pinned into position and the tibial cutting guide was attached.  The guide was translated laterally until it was overlying the ankle joint, notching the medial malleolus excessively.  It was pinned into position.  The medial gutter was then cut with a reciprocating saw and then an oscillating saw was used to cut through the distal tibia just proximal to the articular surface.  The guide was removed.  The cut fragments of bone were all removed.  The talar cutting guide was then attached to the external alignment guide. The talus was cleaned of all cartilage on the dome.  It was reduced to the undersurface of the cutting guide.  A lateral fluoroscopic image confirmed appropriate position of the cutting guide relative to the talus.  The guide was pinned to the talus and oscillating saw was used to resect the talar dome.  The cuts fragment of bone was removed along with the external alignment guide, leaving a pin distally for reference. The talus was sized as an extra, extra small.  The guide was used to place a pin in the center of the cut surface of the talus.  The anterior- posterior cutting guide was then attached and pinned into position.  The anterior chamfer and posterior chamfer cuts were both made.  The guide was removed and replaced with the medial lateral cutting guide.  The medial and lateral cuts were then made with the oscillating saw after the guide was pinned into position.  The cut surfaces of bone were removed with quarter-inch osteotome.  The extra, extra small trial was then inserted and seated maximally after trimming  the medial and lateral gutters again.  It was pinned into position.  The keel hole was drilled and then punched.  The wound was irrigated copiously.  The final talar component was impacted into position, and was noted to be appropriately seated on AP and lateral fluoroscopic images.  At this point, the distal tibia was measured and a size medium tibial trial was selected.  The medial gutter had to be trimmed a bit with a reciprocating saw to allow the medium trial fit in place.  A trial poly spacer was inserted.  AP and lateral fluoroscopic images confirmed appropriate position of the tibial trial.  It was pinned and the barrel holes were drilled and punched.  The trial was removed.  The wound was irrigated copiously. The final medium tibial implant was inserted and impacted into position. AP and lateral fluoroscopic images confirmed appropriate position of this component.  The trial poly was increased in size to 8 mm and then to 9 mm.  Approximately 15 degrees of dorsiflexion was noted with the 9- mm trial poly.  This was  removed.  Wound was again irrigated and the final 9-mm poly spacer was inserted.  The wound was irrigated one final time, and the anterior joint capsule was then repaired over the joint with 0 Vicryl simple and figure-of-eight sutures.  The extensor retinaculum was then repaired with figure-of-eight and simple sutures of 0 Vicryl.  Subcutaneous tissue was approximated with inverted simple sutures of 3-0 Monocryl and a running 3-0 nylon was used to close the skin incision.  The proximal pin was removed and that incision was closed with horizontal mattress suture of 3-0 Prolene.  Sterile dressings were applied followed by well-padded short-leg splint. Tourniquet was released at 2 hours and 9 minutes.  After application of the dressings, the patient was awakened from anesthesia and transported to the recovery room in stable condition.  FOLLOWUP PLAN:  The patient will be  nonweightbearing on her right lower extremity.  She will be observed overnight for pain control.  She will be discharged home tomorrow after physical therapy.     Toni Arthurs, MD     JH/MEDQ  D:  11/14/2012  T:  11/15/2012  Job:  [id number]

## 2012-11-15 NOTE — Progress Notes (Signed)
Discharge instructions completed with patient. Patient complaining of severe 10 out of 10 pain, however, the patient cannot take anymore pain medication until 1830 this evening.  Informed about discharge instructions, cast care, as well as pain management. Also instructed to call MD office with questions.

## 2012-11-15 NOTE — Evaluation (Signed)
Physical Therapy Evaluation Patient Details Name: Paula Potts MRN: [id number] DOB: 1956-04-02 Today's Date: 11/15/2012 Time: 0828-0907 PT Time Calculation (min): 39 min  PT Assessment / Plan / Recommendation Clinical Impression  Pt is a 76 y,o female s/p R total ankle replacement and R gastroc recession. Pt demo fluctuating emotional behavior; would be laughing one min then cry and talk about how she recently lost her job and has no money. Pt lives alone; has neighbors and friends to check on her. Pt demo fair safety awareness with maintaining NWB status. Could benefit from HHPT upon acute D/C . will continue to f/u with pt while in acute setting to increase safety and mobility.     PT Assessment  Patient needs continued PT services    Follow Up Recommendations  Home health PT    Does the patient have the potential to tolerate intense rehabilitation      Barriers to Discharge        Equipment Recommendations  None recommended by PT    Recommendations for Other Services OT consult   Frequency Min 5X/week    Precautions / Restrictions Precautions Precautions: Fall Restrictions Weight Bearing Restrictions: Yes RLE Weight Bearing: Non weight bearing   Pertinent Vitals/Pain Denies pain. Premedicated.      Mobility  Bed Mobility Bed Mobility: Sit to Supine;Sitting - Scoot to Edge of Bed;Supine to Sit Supine to Sit: 5: Supervision;With rails Sitting - Scoot to Edge of Bed: 5: Supervision Details for Bed Mobility Assistance: pt is imupulsive and requires cues for hand placement with bed mobility Transfers Transfers: Sit to Stand;Stand to Sit Sit to Stand: 4: Min guard;From bed;With upper extremity assist Stand to Sit: 4: Min guard;With armrests;To chair/3-in-1 Details for Transfer Assistance: cues for hand placement and to maintain NWB status. Pt demo fair safety awareness; can be impulsive with transfers Ambulation/Gait Ambulation/Gait Assistance: 4: Min  guard Ambulation Distance (Feet): 25 Feet Assistive device: Rolling walker Ambulation/Gait Assistance Details: on RA pt O2 stat at 96%. Pt impulsive at times; demo fair safety awareness and good ability to maintain NWB status on R LE. Cues for gt sequencing and RW safety Gait Pattern: Step-to pattern Stairs: Yes Stairs Assistance: 4: Min guard Stair Management Technique: Two rails;Backwards;With walker Number of Stairs: 2 Wheelchair Mobility Wheelchair Mobility: No    Education   discussed at length strategies for patient to use to transfer safely in/out of car. Pt given strategies to get into the house and with use of assistance and AD. Pt verbalized understanding.   PT Diagnosis: Difficulty walking  PT Problem List: Decreased strength;Decreased range of motion;Decreased balance;Decreased activity tolerance;Decreased mobility;Decreased knowledge of use of DME;Decreased safety awareness;Decreased knowledge of precautions;Decreased cognition PT Treatment Interventions: DME instruction;Gait training;Functional mobility training;Stair training;Therapeutic activities;Therapeutic exercise;Balance training;Neuromuscular re-education;Patient/family education   PT Goals Acute Rehab PT Goals PT Goal Formulation: With patient Time For Goal Achievement: 11/22/12 Potential to Achieve Goals: Good Pt will go Supine/Side to Sit: with modified independence PT Goal: Supine/Side to Sit - Progress: Goal set today Pt will go Sit to Supine/Side: with modified independence PT Goal: Sit to Supine/Side - Progress: Goal set today Pt will go Sit to Stand: with modified independence PT Goal: Sit to Stand - Progress: Goal set today Pt will go Stand to Sit: with modified independence PT Goal: Stand to Sit - Progress: Goal set today Pt will Transfer Bed to Chair/Chair to Bed: with modified independence PT Transfer Goal: Bed to Chair/Chair to Bed - Progress: Goal set today Pt  will Ambulate: >150 feet;with modified  independence;with rolling walker PT Goal: Ambulate - Progress: Goal set today Pt will Go Up / Down Stairs: 3-5 stairs;with supervision;with rail(s);with rolling walker PT Goal: Up/Down Stairs - Progress: Goal set today  Visit Information  Last PT Received On: 11/15/12    Subjective Data  Subjective: i do not have a  job. I have been working out hard to prepare for surgery. Patient Stated Goal: to go home and get a job   Prior Functioning  Home Living Lives With: Alone Available Help at Discharge: Available PRN/intermittently Type of Home: House Home Access: Stairs to enter Secretary/administrator of Steps: 5 Entrance Stairs-Rails: Can reach both Home Layout: 1/2 bath on main level (could live on first floor for awhile; only has half bath) Bathroom Shower/Tub: Engineer, manufacturing systems: Standard Home Adaptive Equipment: Shower chair with back;Bedside commode/3-in-1;Walker - standard;Other (comment) (knee walker scooter) Prior Function Level of Independence: Independent Able to Take Stairs?: Yes Driving: Yes Vocation: Other (comment) Comments: recently laid off Communication Communication: No difficulties Dominant Hand: Left    Cognition  Cognition Overall Cognitive Status: Appears within functional limits for tasks assessed/performed Arousal/Alertness: Awake/alert Orientation Level: Appears intact for tasks assessed Behavior During Session: [facility] for tasks performed (pt gets emotional at time when speaking about job status)    Extremity/Trunk Assessment Right Lower Extremity Assessment RLE ROM/Strength/Tone: Unable to fully assess;Due to precautions Left Lower Extremity Assessment LLE ROM/Strength/Tone: WFL for tasks assessed LLE Sensation: WFL - Light Touch   Balance Balance Balance Assessed: Yes Dynamic Standing Balance Dynamic Standing - Level of Assistance: 5: Stand by assistance  End of Session PT - End of Session Equipment Utilized During Treatment: Gait  belt Activity Tolerance: Patient tolerated treatment well Patient left: in chair;with call bell/phone within reach Nurse Communication: Mobility status  GP Functional Assessment Tool Used: clinical judgement  Functional Limitation: Mobility: Walking and moving around Mobility: Walking and Moving Around Current Status [phone]): At least 1 percent but less than 20 percent impaired, limited or restricted Mobility: Walking and Moving Around Goal Status [phone]): 0 percent impaired, limited or restricted   Donell Sievert, [place] [phone] 11/15/2012, 9:22 AM

## 2012-11-15 NOTE — Progress Notes (Signed)
Utilization Review Completed.   Leenah Seidner, RN, BSN Nurse Case Manager  [phone]  

## 2013-08-08 NOTE — Progress Notes (Signed)
Surgery scheduled for 08/26/13 Preop on 08/18/13 at 1100am.  Need orders in EPIC.  Thank You.

## 2013-08-12 ENCOUNTER — Other Ambulatory Visit: Payer: Self-pay | Admitting: Orthopedic Surgery

## 2013-08-12 NOTE — Progress Notes (Signed)
Preoperative surgical orders have been place into the Epic hospital system for [facility] on 08/12/2013, 9:51 AM  by Patrica Duel for surgery on 08/26/2013.  Preop Total Knee orders including Experal, IV Tylenol, and IV Decadron as long as there are no contraindications to the above medications. Avel Peace, PA-C

## 2013-08-13 ENCOUNTER — Encounter (HOSPITAL_COMMUNITY): Payer: Self-pay | Admitting: Pharmacy Technician

## 2013-08-18 ENCOUNTER — Encounter (HOSPITAL_COMMUNITY): Payer: Self-pay

## 2013-08-18 ENCOUNTER — Encounter (HOSPITAL_COMMUNITY)
Admission: RE | Admit: 2013-08-18 | Discharge: 2013-08-18 | Disposition: A | Discharge status: Home / Self Care | Payer: BC Managed Care – PPO | Source: Ambulatory Visit | Attending: Orthopedic Surgery | Admitting: Orthopedic Surgery

## 2013-08-18 DIAGNOSIS — Z01812 Encounter for preprocedural laboratory examination: Secondary | ICD-10-CM | POA: Insufficient documentation

## 2013-08-18 HISTORY — DX: Other specified disorders of bone density and structure, unspecified site: M85.80

## 2013-08-18 LAB — CBC
MCH: 34 pg (ref 26.0–34.0)
Platelets: 299 10*3/uL (ref 150–400)
RBC: 4.26 MIL/uL (ref 3.87–5.11)
RDW: 12.1 % (ref 11.5–15.5)
WBC: 4.3 10*3/uL (ref 4.0–10.5)

## 2013-08-18 LAB — URINE MICROSCOPIC-ADD ON

## 2013-08-18 LAB — COMPREHENSIVE METABOLIC PANEL WITH GFR
ALT: 18 U/L (ref 0–35)
AST: 23 U/L (ref 0–37)
Albumin: 4.2 g/dL (ref 3.5–5.2)
Alkaline Phosphatase: 92 U/L (ref 39–117)
BUN: 28 mg/dL — ABNORMAL HIGH (ref 6–23)
CO2: 27 meq/L (ref 19–32)
Calcium: 9.7 mg/dL (ref 8.4–10.5)
Chloride: 101 meq/L (ref 96–112)
Creatinine, Ser: 0.75 mg/dL (ref 0.50–1.10)
GFR calc Af Amer: >90 mL/min
GFR calc non Af Amer: >90 mL/min
Glucose, Bld: 92 mg/dL (ref 70–99)
Potassium: 4.1 meq/L (ref 3.5–5.1)
Sodium: 139 meq/L (ref 135–145)
Total Bilirubin: 0.2 mg/dL — ABNORMAL LOW (ref 0.3–1.2)
Total Protein: 7.1 g/dL (ref 6.0–8.3)

## 2013-08-18 LAB — URINALYSIS, ROUTINE W REFLEX MICROSCOPIC
Bilirubin Urine: NEGATIVE
Glucose, UA: NEGATIVE mg/dL
Hgb urine dipstick: NEGATIVE
Ketones, ur: NEGATIVE mg/dL
Specific Gravity, Urine: 1.023 (ref 1.005–1.030)
pH: 6 (ref 5.0–8.0)

## 2013-08-18 LAB — PROTIME-INR
INR: 0.87 (ref 0.00–1.49)
Prothrombin Time: 11.7 s (ref 11.6–15.2)

## 2013-08-18 LAB — APTT: aPTT: 32 s (ref 24–37)

## 2013-08-18 LAB — SURGICAL PCR SCREEN: MRSA, PCR: NEGATIVE

## 2013-08-18 NOTE — Progress Notes (Signed)
EKG from [place] on chart 2014 CXR in EPIC

## 2013-08-18 NOTE — Patient Instructions (Addendum)
Toluwanimi Radebaugh Timmers  08/18/2013                           YOUR PROCEDURE IS SCHEDULED ON: 08/26/13               PLEASE REPORT TO SHORT STAY CENTER AT : 1:00 PM               CALL THIS NUMBER IF ANY PROBLEMS THE DAY OF SURGERY :               [phone]                      REMEMBER:   Do not eat food or drink liquids AFTER MIDNIGHT  May have clear liquids UNTIL 6 HOURS BEFORE SURGERY (10:00 AM)  Clear liquids include soda, tea, black coffee, apple or grape juice, broth.  Take these medicines the morning of surgery with A SIP OF WATER: GABAPENTIN   Do not wear jewelry, make-up   Do not wear lotions, powders, or perfumes.   Do not shave legs or underarms 12 hrs. before surgery (men may shave face)  Do not bring valuables to the hospital.  Contacts, dentures or bridgework may not be worn into surgery.  Leave suitcase in the car. After surgery it may be brought to your room.  For patients admitted to the hospital more than one night, checkout time is 11:00                          The day of discharge.   Patients discharged the day of surgery will not be allowed to drive home                             If going home same day of surgery, must have someone stay with you first                           24 hrs at home and arrange for some one to drive you home from hospital.    Special Instructions:   Please read over the following fact sheets that you were given:               1. MRSA  INFORMATION                      2. [facility] PREPARING FOR SURGERY SHEET               3. INCENTIVE SPIROMETER                                                X_____________________________________________________________________        Failure to follow these instructions may result in cancellation of your surgery

## 2013-08-19 ENCOUNTER — Ambulatory Visit: Payer: BC Managed Care – PPO

## 2013-08-19 NOTE — Progress Notes (Signed)
Pt notified of +PCR screen - pt states she still has tube of Mupuricin from last surg - is not outdated - and will use as instructed. Message left with Imelda Pillow at office.

## 2013-08-24 ENCOUNTER — Other Ambulatory Visit: Payer: Self-pay | Admitting: Surgical

## 2013-08-24 NOTE — H&P (Signed)
TOTAL KNEE ADMISSION H&P  Patient is being admitted for left total knee arthroplasty.  Subjective:  Chief Complaint:left knee pain.  HPI: Paula Potts, 57 y.o. female, has a history of pain and functional disability in the left knee due to arthritis and has failed non-surgical conservative treatments for greater than 12 weeks to includeNSAID's and/or analgesics, corticosteriod injections and activity modification.  Onset of symptoms was gradual, starting >10 years ago with gradually worsening course since that time. The patient noted prior procedures on the knee to include  arthroscopy and menisectomy on the left knee(s).  Patient currently rates pain in the left knee(s) at 6 out of 10 with activity. Patient has night pain, worsening of pain with activity and weight bearing, pain that interferes with activities of daily living, pain with passive range of motion, crepitus and joint swelling.  Patient has evidence of periarticular osteophytes and joint space narrowing by imaging studies. There is no active infection.   Past Medical History  Diagnosis Date  . PONV (postoperative nausea and vomiting)   . Hypertension   . Arthritis   . DJD (degenerative joint disease)   . Scoliosis     mild lower back  . Osteoarthritis   . Depression   . Anxiety   . Osteopenia     Past Surgical History  Procedure Laterality Date  . Total hip arthroplasty  2000    right  . Total hip arthroplasty  2010    left  . Cosmetic surgery  2006    tummy tuck  . Breast enhancement surgery  99  . Knee arthroscopy  1993    left  . Total shoulder arthroplasty  2012    right-dsc  . Total shoulder arthroplasty  05/14/2012    Procedure: TOTAL SHOULDER ARTHROPLASTY;  Surgeon: Wyn Forster., MD;  Location: [facility];  Service: Orthopedics;  Laterality: Left;  Biceps Tenodesis also  . Total ankle arthroplasty Right 11/14/2012    Procedure: TOTAL ANKLE ARTHOPLASTY;  Surgeon: Toni Arthurs, MD;   Location: [facility] OR;  Service: Orthopedics;  Laterality: Right;  . Gastrocnemius recession Right 11/14/2012    Procedure: GASTROCNEMIUS RECESSION;  Surgeon: Toni Arthurs, MD;  Location: [facility] OR;  Service: Orthopedics;  Laterality: Right;  . Cesarean section  1995     Current outpatient prescriptions: calcium carbonate (OS-CAL) 600 MG TABS, Take 1,200 mg by mouth daily. , Disp: , Rfl: ;   cholecalciferol (VITAMIN D) 1000 UNITS tablet, Take 1,000 Units by mouth daily., Disp: , Rfl: ;   gabapentin (NEURONTIN) 300 MG capsule, Take 300 mg by mouth 2 (two) times daily. , Disp: , Rfl: ;   lisinopril (PRINIVIL,ZESTRIL) 20 MG tablet, Take 20 mg by mouth every morning. , Disp: , Rfl:  Multiple Vitamin (MULTIVITAMIN WITH MINERALS) TABS tablet, Take 1 tablet by mouth daily., Disp: , Rfl: ;   naproxen sodium (ANAPROX) 220 MG tablet, Take 220 mg by mouth 2 (two) times daily with a meal., Disp: , Rfl:   Allergies  Allergen Reactions  . Codeine Nausea And Vomiting  . Hydrocodone Nausea And Vomiting  . Morphine And Related Nausea And Vomiting  . Oxycodone Nausea And Vomiting    History  Substance Use Topics  . Smoking status: Never Smoker   . Smokeless tobacco: Never Used  . Alcohol Use: Yes     Comment: occ    Family History Father deceased age 86 due to heart disease Mother deceased age 78 due to "old age"  Review of Systems  Constitutional: Positive for diaphoresis. Negative for fever, chills, weight loss and malaise/fatigue.  HENT: Negative.   Eyes: Negative.   Respiratory: Negative.   Cardiovascular: Negative.   Gastrointestinal: Negative.   Genitourinary: Negative.   Musculoskeletal: Positive for joint pain. Negative for back pain, falls, myalgias and neck pain.       Left knee pain  Skin: Negative.   Neurological: Negative.  Negative for weakness.  Endo/Heme/Allergies: Negative.   Psychiatric/Behavioral: Negative.     Objective:  Physical Exam  Constitutional: She is oriented to  person, place, and time. She appears well-developed and well-nourished. No distress.  HENT:  Head: Normocephalic and atraumatic.  Right Ear: External ear normal.  Left Ear: External ear normal.  Nose: Nose normal.  Mouth/Throat: Oropharynx is clear and moist.  Eyes: Conjunctivae and EOM are normal.  Neck: Normal range of motion. Neck supple.  Cardiovascular: Normal rate, regular rhythm, normal heart sounds and intact distal pulses.   No murmur heard. Respiratory: Effort normal and breath sounds normal. No respiratory distress. She has no wheezes.  GI: Soft. Bowel sounds are normal.  Musculoskeletal:       Right hip: Normal.       Left hip: Normal.       Right knee: Normal.       Left knee: She exhibits decreased range of motion and swelling. She exhibits no effusion and no erythema. Tenderness found. Medial joint line and lateral joint line tenderness noted.       Right lower leg: She exhibits no tenderness and no swelling.       Left lower leg: She exhibits no tenderness and no swelling.  Her left knee shows no effusion. There is marked crepitus on range of motion in the knee. She has a valgus deformity. She is tender lateral greater than medial. Range 5 to 120. There is moderate effusion. There is no instability.  Neurological: She is alert and oriented to person, place, and time. She has normal strength and normal reflexes. No sensory deficit.  Skin: No rash noted. She is not diaphoretic. No erythema.  Psychiatric: She has a normal mood and affect. Her behavior is normal.     Vitals Weight: 120 lb Height: 62 in Body Surface Area: 1.54 m Body Mass Index: 21.95 kg/m Pulse: 72 (Regular) BP: 126/72 (Sitting, Left Arm, Standard)  Imaging Review Plain radiographs demonstrate severe degenerative joint disease of the left knee(s). The overall alignment isvalgus. The bone quality appears to be good for age and reported activity level.  Assessment/Plan:  End stage  arthritis, left knee   The patient history, physical examination, clinical judgment of the provider and imaging studies are consistent with end stage degenerative joint disease of the left knee(s) and total knee arthroplasty is deemed medically necessary. The treatment options including medical management, injection therapy arthroscopy and arthroplasty were discussed at length. The risks and benefits of total knee arthroplasty were presented and reviewed. The risks due to aseptic loosening, infection, stiffness, patella tracking problems, thromboembolic complications and other imponderables were discussed. The patient acknowledged the explanation, agreed to proceed with the plan and consent was signed. Patient is being admitted for inpatient treatment for surgery, pain control, PT, OT, prophylactic antibiotics, VTE prophylaxis, progressive ambulation and ADL's and discharge planning. The patient is planning to be discharged home with home health services      [place], [place]

## 2013-08-26 ENCOUNTER — Encounter (HOSPITAL_COMMUNITY): Payer: BC Managed Care – PPO | Admitting: Anesthesiology

## 2013-08-26 ENCOUNTER — Inpatient Hospital Stay (HOSPITAL_COMMUNITY)
Admission: RE | Admit: 2013-08-26 | Discharge: 2013-08-28 | DRG: 470 | Disposition: A | Discharge status: Home Health | Payer: BC Managed Care – PPO | Source: Ambulatory Visit | Attending: Orthopedic Surgery | Admitting: Orthopedic Surgery

## 2013-08-26 ENCOUNTER — Inpatient Hospital Stay (HOSPITAL_COMMUNITY): Payer: BC Managed Care – PPO | Admitting: Anesthesiology

## 2013-08-26 ENCOUNTER — Encounter (HOSPITAL_COMMUNITY)
Admission: RE | Disposition: A | Discharge status: Home Health | Payer: Self-pay | Source: Ambulatory Visit | Attending: Orthopedic Surgery

## 2013-08-26 ENCOUNTER — Encounter (HOSPITAL_COMMUNITY): Payer: Self-pay

## 2013-08-26 DIAGNOSIS — D62 Acute posthemorrhagic anemia: Secondary | ICD-10-CM

## 2013-08-26 DIAGNOSIS — F411 Generalized anxiety disorder: Secondary | ICD-10-CM | POA: Diagnosis present

## 2013-08-26 DIAGNOSIS — F3289 Other specified depressive episodes: Secondary | ICD-10-CM | POA: Diagnosis present

## 2013-08-26 DIAGNOSIS — Z96652 Presence of left artificial knee joint: Secondary | ICD-10-CM

## 2013-08-26 DIAGNOSIS — M4120 Other idiopathic scoliosis, site unspecified: Secondary | ICD-10-CM | POA: Diagnosis present

## 2013-08-26 DIAGNOSIS — E871 Hypo-osmolality and hyponatremia: Secondary | ICD-10-CM

## 2013-08-26 DIAGNOSIS — IMO0002 Reserved for concepts with insufficient information to code with codable children: Secondary | ICD-10-CM

## 2013-08-26 DIAGNOSIS — Z8249 Family history of ischemic heart disease and other diseases of the circulatory system: Secondary | ICD-10-CM

## 2013-08-26 DIAGNOSIS — M1710 Unilateral primary osteoarthritis, unspecified knee: Secondary | ICD-10-CM | POA: Diagnosis present

## 2013-08-26 DIAGNOSIS — Z96649 Presence of unspecified artificial hip joint: Secondary | ICD-10-CM

## 2013-08-26 DIAGNOSIS — I10 Essential (primary) hypertension: Secondary | ICD-10-CM | POA: Diagnosis present

## 2013-08-26 DIAGNOSIS — M179 Osteoarthritis of knee, unspecified: Secondary | ICD-10-CM

## 2013-08-26 DIAGNOSIS — F329 Major depressive disorder, single episode, unspecified: Secondary | ICD-10-CM | POA: Diagnosis present

## 2013-08-26 DIAGNOSIS — Z01812 Encounter for preprocedural laboratory examination: Secondary | ICD-10-CM

## 2013-08-26 DIAGNOSIS — M899 Disorder of bone, unspecified: Secondary | ICD-10-CM | POA: Diagnosis present

## 2013-08-26 HISTORY — PX: TOTAL KNEE ARTHROPLASTY: SHX125

## 2013-08-26 LAB — TYPE AND SCREEN: Antibody Screen: NEGATIVE

## 2013-08-26 SURGERY — ARTHROPLASTY, KNEE, TOTAL
Anesthesia: General | Site: Knee | Laterality: Left

## 2013-08-26 MED ORDER — DEXAMETHASONE SODIUM PHOSPHATE 4 MG/ML IJ SOLN: INTRAMUSCULAR | Status: DC | PRN

## 2013-08-26 MED ORDER — MENTHOL 3 MG MT LOZG: 1.0000 | LOZENGE | OROMUCOSAL | Status: DC | PRN

## 2013-08-26 MED ORDER — HYDROMORPHONE HCL PF 1 MG/ML IJ SOLN
0.5000 mg | INTRAMUSCULAR | Status: DC | PRN
  Administered 2013-08-27: 1 mg via INTRAVENOUS
  Filled 2013-08-26 (×2): qty 1

## 2013-08-26 MED ORDER — HYDROMORPHONE HCL 2 MG PO TABS
2.0000 mg | ORAL_TABLET | ORAL | Status: DC | PRN
  Administered 2013-08-27: 4 mg via ORAL
  Administered 2013-08-27 – 2013-08-28 (×4): 2 mg via ORAL
  Administered 2013-08-28 (×2): 4 mg via ORAL
  Filled 2013-08-26 (×4): qty 1
  Filled 2013-08-26: qty 2
  Filled 2013-08-26: qty 1
  Filled 2013-08-26: qty 2

## 2013-08-26 MED ORDER — ONDANSETRON HCL 4 MG/2ML IJ SOLN
INTRAMUSCULAR | Status: AC
  Filled 2013-08-26: qty 2

## 2013-08-26 MED ORDER — LIDOCAINE HCL (CARDIAC) 20 MG/ML IV SOLN
INTRAVENOUS | Status: AC
  Filled 2013-08-26: qty 5

## 2013-08-26 MED ORDER — HYDRALAZINE HCL 20 MG/ML IJ SOLN
INTRAMUSCULAR | Status: DC | PRN
  Administered 2013-08-26 (×2): 5 mg via INTRAVENOUS

## 2013-08-26 MED ORDER — FENTANYL CITRATE 0.05 MG/ML IJ SOLN
INTRAMUSCULAR | Status: AC
  Filled 2013-08-26: qty 2

## 2013-08-26 MED ORDER — SODIUM CHLORIDE 0.9 % IJ SOLN
INTRAMUSCULAR | Status: AC
  Filled 2013-08-26: qty 50

## 2013-08-26 MED ORDER — PROMETHAZINE HCL 25 MG/ML IJ SOLN
6.2500 mg | INTRAMUSCULAR | Status: DC | PRN
  Administered 2013-08-26: 6.25 mg via INTRAVENOUS

## 2013-08-26 MED ORDER — SCOPOLAMINE 1 MG/3DAYS TD PT72
1.0000 | MEDICATED_PATCH | TRANSDERMAL | Status: DC
  Administered 2013-08-26: 1 via TRANSDERMAL
  Administered 2013-08-26: 1.5 mg via TRANSDERMAL
  Filled 2013-08-26: qty 1

## 2013-08-26 MED ORDER — BUPIVACAINE HCL 0.25 % IJ SOLN
INTRAMUSCULAR | Status: DC | PRN
  Administered 2013-08-26: 20 mL

## 2013-08-26 MED ORDER — NEOSTIGMINE METHYLSULFATE 1 MG/ML IJ SOLN
INTRAMUSCULAR | Status: DC | PRN
  Administered 2013-08-26: 3 mg via INTRAMUSCULAR

## 2013-08-26 MED ORDER — METHOCARBAMOL 100 MG/ML IJ SOLN
500.0000 mg | Freq: Four times a day (QID) | INTRAVENOUS | Status: DC | PRN
  Administered 2013-08-26: 500 mg via INTRAVENOUS
  Filled 2013-08-26: qty 5

## 2013-08-26 MED ORDER — HYDROMORPHONE HCL PF 2 MG/ML IJ SOLN
INTRAMUSCULAR | Status: AC
  Filled 2013-08-26: qty 1

## 2013-08-26 MED ORDER — ACETAMINOPHEN 325 MG PO TABS
650.0000 mg | ORAL_TABLET | Freq: Four times a day (QID) | ORAL | Status: DC | PRN
  Administered 2013-08-27 – 2013-08-28 (×2): 650 mg via ORAL
  Filled 2013-08-26 (×2): qty 2

## 2013-08-26 MED ORDER — DEXTROSE-NACL 5-0.9 % IV SOLN
INTRAVENOUS | Status: DC
  Administered 2013-08-26: 22:00:00 via INTRAVENOUS

## 2013-08-26 MED ORDER — METOCLOPRAMIDE HCL 10 MG PO TABS: 5.0000 mg | ORAL_TABLET | Freq: Three times a day (TID) | ORAL | Status: DC | PRN

## 2013-08-26 MED ORDER — METHOCARBAMOL 500 MG PO TABS
500.0000 mg | ORAL_TABLET | Freq: Four times a day (QID) | ORAL | Status: DC | PRN
  Administered 2013-08-27 – 2013-08-28 (×3): 500 mg via ORAL
  Filled 2013-08-26 (×3): qty 1

## 2013-08-26 MED ORDER — ONDANSETRON HCL 4 MG/2ML IJ SOLN
INTRAMUSCULAR | Status: DC | PRN
  Administered 2013-08-26 (×2): 2 mg via INTRAVENOUS

## 2013-08-26 MED ORDER — BUPIVACAINE HCL (PF) 0.25 % IJ SOLN
INTRAMUSCULAR | Status: AC
  Filled 2013-08-26: qty 30

## 2013-08-26 MED ORDER — KETOROLAC TROMETHAMINE 15 MG/ML IJ SOLN
7.5000 mg | Freq: Four times a day (QID) | INTRAMUSCULAR | Status: AC | PRN
  Administered 2013-08-26: 7.5 mg via INTRAVENOUS
  Filled 2013-08-26: qty 1

## 2013-08-26 MED ORDER — DIPHENHYDRAMINE HCL 12.5 MG/5ML PO ELIX: 12.5000 mg | ORAL_SOLUTION | ORAL | Status: DC | PRN

## 2013-08-26 MED ORDER — PROPOFOL 10 MG/ML IV BOLUS
INTRAVENOUS | Status: AC
  Filled 2013-08-26: qty 20

## 2013-08-26 MED ORDER — MIDAZOLAM HCL 2 MG/2ML IJ SOLN
INTRAMUSCULAR | Status: AC
  Filled 2013-08-26: qty 2

## 2013-08-26 MED ORDER — BUPIVACAINE LIPOSOME 1.3 % IJ SUSP
INTRAMUSCULAR | Status: DC | PRN
  Administered 2013-08-26: 20 mL

## 2013-08-26 MED ORDER — ONDANSETRON HCL 4 MG PO TABS
4.0000 mg | ORAL_TABLET | Freq: Four times a day (QID) | ORAL | Status: DC | PRN
  Administered 2013-08-28: 4 mg via ORAL
  Filled 2013-08-26: qty 1

## 2013-08-26 MED ORDER — DOCUSATE SODIUM 100 MG PO CAPS
100.0000 mg | ORAL_CAPSULE | Freq: Two times a day (BID) | ORAL | Status: DC
  Administered 2013-08-27 – 2013-08-28 (×3): 100 mg via ORAL

## 2013-08-26 MED ORDER — GLYCOPYRROLATE 0.2 MG/ML IJ SOLN
INTRAMUSCULAR | Status: AC
  Filled 2013-08-26: qty 2

## 2013-08-26 MED ORDER — ACETAMINOPHEN 650 MG RE SUPP: 650.0000 mg | Freq: Four times a day (QID) | RECTAL | Status: DC | PRN

## 2013-08-26 MED ORDER — MIDAZOLAM HCL 5 MG/5ML IJ SOLN
INTRAMUSCULAR | Status: DC | PRN
  Administered 2013-08-26: 2 mg via INTRAVENOUS

## 2013-08-26 MED ORDER — PHENOL 1.4 % MT LIQD: 1.0000 | OROMUCOSAL | Status: DC | PRN

## 2013-08-26 MED ORDER — TRAMADOL HCL 50 MG PO TABS
50.0000 mg | ORAL_TABLET | Freq: Four times a day (QID) | ORAL | Status: DC | PRN
  Administered 2013-08-28: 100 mg via ORAL
  Filled 2013-08-26 (×2): qty 1

## 2013-08-26 MED ORDER — KETAMINE HCL 10 MG/ML IJ SOLN
INTRAMUSCULAR | Status: AC
  Filled 2013-08-26: qty 1

## 2013-08-26 MED ORDER — POLYETHYLENE GLYCOL 3350 17 G PO PACK: 17.0000 g | PACK | Freq: Every day | ORAL | Status: DC | PRN

## 2013-08-26 MED ORDER — METOCLOPRAMIDE HCL 5 MG/ML IJ SOLN
INTRAMUSCULAR | Status: DC | PRN
  Administered 2013-08-26: 10 mg via INTRAVENOUS

## 2013-08-26 MED ORDER — BISACODYL 10 MG RE SUPP: 10.0000 mg | Freq: Every day | RECTAL | Status: DC | PRN

## 2013-08-26 MED ORDER — BUPIVACAINE LIPOSOME 1.3 % IJ SUSP
20.0000 mL | Freq: Once | INTRAMUSCULAR | Status: DC
  Filled 2013-08-26: qty 20

## 2013-08-26 MED ORDER — ENOXAPARIN SODIUM 30 MG/0.3ML ~~LOC~~ SOLN
30.0000 mg | Freq: Two times a day (BID) | SUBCUTANEOUS | Status: DC
  Administered 2013-08-27 – 2013-08-28 (×3): 30 mg via SUBCUTANEOUS
  Filled 2013-08-26 (×5): qty 0.3

## 2013-08-26 MED ORDER — HYDROMORPHONE HCL PF 1 MG/ML IJ SOLN: 0.2500 mg | INTRAMUSCULAR | Status: DC | PRN

## 2013-08-26 MED ORDER — KETAMINE HCL 10 MG/ML IJ SOLN
INTRAMUSCULAR | Status: DC | PRN
  Administered 2013-08-26: 25 mg via INTRAVENOUS

## 2013-08-26 MED ORDER — METOCLOPRAMIDE HCL 5 MG/ML IJ SOLN
5.0000 mg | Freq: Three times a day (TID) | INTRAMUSCULAR | Status: DC | PRN
  Administered 2013-08-27: 10 mg via INTRAVENOUS
  Filled 2013-08-26: qty 2

## 2013-08-26 MED ORDER — ACETAMINOPHEN 10 MG/ML IV SOLN
1000.0000 mg | Freq: Once | INTRAVENOUS | Status: AC
  Administered 2013-08-26: 1000 mg via INTRAVENOUS
  Filled 2013-08-26: qty 100

## 2013-08-26 MED ORDER — DEXAMETHASONE 6 MG PO TABS
10.0000 mg | ORAL_TABLET | Freq: Every day | ORAL | Status: AC
  Administered 2013-08-27: 10 mg via ORAL
  Filled 2013-08-26: qty 1

## 2013-08-26 MED ORDER — ROCURONIUM BROMIDE 100 MG/10ML IV SOLN
INTRAVENOUS | Status: DC | PRN
  Administered 2013-08-26: 40 mg via INTRAVENOUS

## 2013-08-26 MED ORDER — LACTATED RINGERS IV SOLN
INTRAVENOUS | Status: DC
  Administered 2013-08-26: 15:00:00 via INTRAVENOUS
  Administered 2013-08-26: 1000 mL via INTRAVENOUS
  Administered 2013-08-26: 17:00:00 via INTRAVENOUS

## 2013-08-26 MED ORDER — PHENYLEPHRINE 40 MCG/ML (10ML) SYRINGE FOR IV PUSH (FOR BLOOD PRESSURE SUPPORT)
PREFILLED_SYRINGE | INTRAVENOUS | Status: AC
  Filled 2013-08-26: qty 10

## 2013-08-26 MED ORDER — PROPOFOL 10 MG/ML IV BOLUS
INTRAVENOUS | Status: DC | PRN
  Administered 2013-08-26: 150 mg via INTRAVENOUS

## 2013-08-26 MED ORDER — SODIUM CHLORIDE 0.9 % IV SOLN: INTRAVENOUS | Status: DC

## 2013-08-26 MED ORDER — GABAPENTIN 300 MG PO CAPS
300.0000 mg | ORAL_CAPSULE | Freq: Two times a day (BID) | ORAL | Status: DC
  Administered 2013-08-27 – 2013-08-28 (×3): 300 mg via ORAL
  Filled 2013-08-26 (×5): qty 1

## 2013-08-26 MED ORDER — CEFAZOLIN SODIUM-DEXTROSE 2-3 GM-% IV SOLR
2.0000 g | INTRAVENOUS | Status: AC
  Administered 2013-08-26: 2 g via INTRAVENOUS

## 2013-08-26 MED ORDER — SCOPOLAMINE 1 MG/3DAYS TD PT72
MEDICATED_PATCH | TRANSDERMAL | Status: AC
  Filled 2013-08-26: qty 1

## 2013-08-26 MED ORDER — SODIUM CHLORIDE 0.9 % IJ SOLN
INTRAMUSCULAR | Status: DC | PRN
  Administered 2013-08-26: 30 mL via INTRAVENOUS

## 2013-08-26 MED ORDER — CEFAZOLIN SODIUM 1-5 GM-% IV SOLN
1.0000 g | Freq: Four times a day (QID) | INTRAVENOUS | Status: AC
  Administered 2013-08-26 – 2013-08-27 (×2): 1 g via INTRAVENOUS
  Filled 2013-08-26 (×2): qty 50

## 2013-08-26 MED ORDER — NEOSTIGMINE METHYLSULFATE 1 MG/ML IJ SOLN
INTRAMUSCULAR | Status: AC
  Filled 2013-08-26: qty 10

## 2013-08-26 MED ORDER — GLYCOPYRROLATE 0.2 MG/ML IJ SOLN
INTRAMUSCULAR | Status: DC | PRN
  Administered 2013-08-26: 0.4 mg via INTRAVENOUS

## 2013-08-26 MED ORDER — FENTANYL CITRATE 0.05 MG/ML IJ SOLN
INTRAMUSCULAR | Status: AC
  Filled 2013-08-26: qty 5

## 2013-08-26 MED ORDER — DEXAMETHASONE SODIUM PHOSPHATE 10 MG/ML IJ SOLN
INTRAMUSCULAR | Status: AC
  Filled 2013-08-26: qty 1

## 2013-08-26 MED ORDER — HYDROMORPHONE HCL PF 1 MG/ML IJ SOLN
INTRAMUSCULAR | Status: DC | PRN
  Administered 2013-08-26: 2 mg via INTRAVENOUS

## 2013-08-26 MED ORDER — FENTANYL CITRATE 0.05 MG/ML IJ SOLN
INTRAMUSCULAR | Status: DC | PRN
  Administered 2013-08-26: 150 ug via INTRAVENOUS
  Administered 2013-08-26 (×2): 100 ug via INTRAVENOUS

## 2013-08-26 MED ORDER — CEFAZOLIN SODIUM-DEXTROSE 2-3 GM-% IV SOLR
INTRAVENOUS | Status: AC
  Filled 2013-08-26: qty 50

## 2013-08-26 MED ORDER — ACETAMINOPHEN 500 MG PO TABS
1000.0000 mg | ORAL_TABLET | Freq: Four times a day (QID) | ORAL | Status: AC
  Administered 2013-08-27 (×3): 1000 mg via ORAL
  Filled 2013-08-26 (×4): qty 2

## 2013-08-26 MED ORDER — CHLORHEXIDINE GLUCONATE 4 % EX LIQD: 60.0000 mL | Freq: Once | CUTANEOUS | Status: DC

## 2013-08-26 MED ORDER — TRANEXAMIC ACID 100 MG/ML IV SOLN
1000.0000 mg | INTRAVENOUS | Status: AC
  Administered 2013-08-26: 1000 mg via INTRAVENOUS
  Filled 2013-08-26: qty 10

## 2013-08-26 MED ORDER — FLEET ENEMA 7-19 GM/118ML RE ENEM: 1.0000 | ENEMA | Freq: Once | RECTAL | Status: AC | PRN

## 2013-08-26 MED ORDER — ONDANSETRON HCL 4 MG/2ML IJ SOLN
4.0000 mg | Freq: Four times a day (QID) | INTRAMUSCULAR | Status: DC | PRN
  Administered 2013-08-26: 4 mg via INTRAVENOUS
  Filled 2013-08-26 (×2): qty 2

## 2013-08-26 MED ORDER — DEXAMETHASONE SODIUM PHOSPHATE 10 MG/ML IJ SOLN
10.0000 mg | Freq: Every day | INTRAMUSCULAR | Status: AC
  Filled 2013-08-26: qty 1

## 2013-08-26 MED ORDER — PROMETHAZINE HCL 25 MG/ML IJ SOLN
INTRAMUSCULAR | Status: AC
  Filled 2013-08-26: qty 1

## 2013-08-26 MED ORDER — DEXAMETHASONE SODIUM PHOSPHATE 10 MG/ML IJ SOLN
10.0000 mg | Freq: Once | INTRAMUSCULAR | Status: AC
  Administered 2013-08-26: 10 mg via INTRAVENOUS

## 2013-08-26 MED ORDER — LIDOCAINE HCL (CARDIAC) 20 MG/ML IV SOLN
INTRAVENOUS | Status: DC | PRN
  Administered 2013-08-26: 50 mg via INTRAVENOUS

## 2013-08-26 SURGICAL SUPPLY — 54 items
BAG ZIPLOCK 12X15 (MISCELLANEOUS) ×2 IMPLANT
BANDAGE ELASTIC 6 VELCRO ST LF (GAUZE/BANDAGES/DRESSINGS) ×2 IMPLANT
BANDAGE ESMARK 6X9 LF (GAUZE/BANDAGES/DRESSINGS) ×1 IMPLANT
BLADE SAG 18X100X1.27 (BLADE) ×2 IMPLANT
BLADE SAW SGTL 11.0X1.19X90.0M (BLADE) ×2 IMPLANT
BNDG ESMARK 6X9 LF (GAUZE/BANDAGES/DRESSINGS) ×2
BOWL SMART MIX CTS (DISPOSABLE) ×2 IMPLANT
CAP KNEE ATTUNE RP ×2 IMPLANT
CEMENT HV SMART SET (Cement) ×4 IMPLANT
CUFF TOURN SGL QUICK 34 (TOURNIQUET CUFF) ×1
CUFF TRNQT CYL 34X4X40X1 (TOURNIQUET CUFF) ×1 IMPLANT
DECANTER SPIKE VIAL GLASS SM (MISCELLANEOUS) ×2 IMPLANT
DRAPE EXTREMITY T 121X128X90 (DRAPE) ×2 IMPLANT
DRAPE POUCH INSTRU U-SHP 10X18 (DRAPES) ×2 IMPLANT
DRAPE U-SHAPE 47X51 STRL (DRAPES) ×2 IMPLANT
DRSG ADAPTIC 3X8 NADH LF (GAUZE/BANDAGES/DRESSINGS) ×2 IMPLANT
DURAPREP 26ML APPLICATOR (WOUND CARE) ×2 IMPLANT
ELECT REM PT RETURN 9FT ADLT (ELECTROSURGICAL) ×2
ELECTRODE REM PT RTRN 9FT ADLT (ELECTROSURGICAL) ×1 IMPLANT
EVACUATOR 1/8 PVC DRAIN (DRAIN) ×2 IMPLANT
FACESHIELD LNG OPTICON STERILE (SAFETY) ×10 IMPLANT
GLOVE BIO SURGEON STRL SZ7.5 (GLOVE) IMPLANT
GLOVE BIO SURGEON STRL SZ8 (GLOVE) ×2 IMPLANT
GLOVE BIOGEL PI IND STRL 8 (GLOVE) ×2 IMPLANT
GLOVE BIOGEL PI INDICATOR 8 (GLOVE) ×2
GLOVE SURG SS PI 6.5 STRL IVOR (GLOVE) IMPLANT
GOWN PREVENTION PLUS LG XLONG (DISPOSABLE) ×2 IMPLANT
GOWN STRL REIN XL XLG (GOWN DISPOSABLE) IMPLANT
HANDPIECE INTERPULSE COAX TIP (DISPOSABLE) ×1
IMMOBILIZER KNEE 20 (SOFTGOODS) ×2
IMMOBILIZER KNEE 20 THIGH 36 (SOFTGOODS) ×1 IMPLANT
KIT BASIN OR (CUSTOM PROCEDURE TRAY) ×2 IMPLANT
MANIFOLD NEPTUNE II (INSTRUMENTS) ×2 IMPLANT
NDL SAFETY ECLIPSE 18X1.5 (NEEDLE) ×2 IMPLANT
NEEDLE HYPO 18GX1.5 SHARP (NEEDLE) ×2
NS IRRIG 1000ML POUR BTL (IV SOLUTION) ×2 IMPLANT
PACK TOTAL JOINT (CUSTOM PROCEDURE TRAY) ×2 IMPLANT
PAD ABD 8X10 STRL (GAUZE/BANDAGES/DRESSINGS) ×2 IMPLANT
PADDING CAST COTTON 6X4 STRL (CAST SUPPLIES) ×2 IMPLANT
POSITIONER SURGICAL ARM (MISCELLANEOUS) ×2 IMPLANT
SET HNDPC FAN SPRY TIP SCT (DISPOSABLE) ×1 IMPLANT
SPONGE GAUZE 4X4 12PLY (GAUZE/BANDAGES/DRESSINGS) ×2 IMPLANT
STRIP CLOSURE SKIN 1/2X4 (GAUZE/BANDAGES/DRESSINGS) ×4 IMPLANT
SUCTION FRAZIER 12FR DISP (SUCTIONS) ×2 IMPLANT
SUT MNCRL AB 4-0 PS2 18 (SUTURE) ×2 IMPLANT
SUT VIC AB 2-0 CT1 27 (SUTURE) ×3
SUT VIC AB 2-0 CT1 TAPERPNT 27 (SUTURE) ×3 IMPLANT
SUT VLOC 180 0 24IN GS25 (SUTURE) ×2 IMPLANT
SYR 20CC LL (SYRINGE) ×2 IMPLANT
SYR 50ML LL SCALE MARK (SYRINGE) ×2 IMPLANT
TOWEL OR 17X26 10 PK STRL BLUE (TOWEL DISPOSABLE) ×4 IMPLANT
TRAY FOLEY CATH 14FRSI W/METER (CATHETERS) ×2 IMPLANT
WATER STERILE IRR 1500ML POUR (IV SOLUTION) ×2 IMPLANT
WRAP KNEE MAXI GEL POST OP (GAUZE/BANDAGES/DRESSINGS) ×2 IMPLANT

## 2013-08-26 NOTE — Transfer of Care (Signed)
Immediate Anesthesia Transfer of Care Note  Patient: Paula Potts  Procedure(s) Performed: Procedure(s): LEFT TOTAL KNEE ARTHROPLASTY (Left)  Patient Location: PACU  Anesthesia Type:General  Level of Consciousness: awake and sedated  Airway & Oxygen Therapy: Patient Spontanous Breathing and Patient connected to face mask oxygen  Post-op Assessment: Report given to PACU RN and Post -op Vital signs reviewed and stable  Post vital signs: stable  Complications: No apparent anesthesia complications

## 2013-08-26 NOTE — Interval H&P Note (Signed)
History and Physical Interval Note:  08/26/2013 3:28 PM  Paula Potts  has presented today for surgery, with the diagnosis of OA OF LEFT KNEE  The various methods of treatment have been discussed with the patient and family. After consideration of risks, benefits and other options for treatment, the patient has consented to  Procedure(s): LEFT TOTAL KNEE ARTHROPLASTY (Left) as a surgical intervention .  The patient's history has been reviewed, patient examined, no change in status, stable for surgery.  I have reviewed the patient's chart and labs.  Questions were answered to the patient's satisfaction.     Loanne Drilling

## 2013-08-26 NOTE — Anesthesia Preprocedure Evaluation (Addendum)
Anesthesia Evaluation  Patient identified by MRN, date of birth, ID band Patient awake    Reviewed: Allergy & Precautions, H&P , NPO status , Patient's Chart, lab work & pertinent test results  History of Anesthesia Complications (+) PONV and history of anesthetic complications  Airway Mallampati: II TM Distance: >3 FB Neck ROM: Full    Dental no notable dental hx.    Pulmonary neg pulmonary ROS,  breath sounds clear to auscultation  Pulmonary exam normal       Cardiovascular Exercise Tolerance: Good hypertension, Pt. on medications negative cardio ROS  Rhythm:Regular Rate:Normal     Neuro/Psych PSYCHIATRIC DISORDERS Anxiety Depression negative neurological ROS     GI/Hepatic negative GI ROS, Neg liver ROS,   Endo/Other  negative endocrine ROS  Renal/GU negative Renal ROS  negative genitourinary   Musculoskeletal negative musculoskeletal ROS (+)   Abdominal   Peds negative pediatric ROS (+)  Hematology negative hematology ROS (+)   Anesthesia Other Findings   Reproductive/Obstetrics negative OB ROS                          Anesthesia Physical Anesthesia Plan  ASA: II  Anesthesia Plan: General   Post-op Pain Management:    Induction: Intravenous  Airway Management Planned: Oral ETT  Additional Equipment:   Intra-op Plan:   Post-operative Plan: Extubation in OR  Informed Consent: I have reviewed the patients History and Physical, chart, labs and discussed the procedure including the risks, benefits and alternatives for the proposed anesthesia with the patient or authorized representative who has indicated his/her understanding and acceptance.   Dental advisory given  Plan Discussed with: CRNA  Anesthesia Plan Comments: (Discussed r/b general versus spinal. Prefers general.)       Anesthesia Quick Evaluation

## 2013-08-26 NOTE — Anesthesia Postprocedure Evaluation (Signed)
  Anesthesia Post-op Note  Patient: Paula Potts  Procedure(s) Performed: Procedure(s) (LRB): LEFT TOTAL KNEE ARTHROPLASTY (Left)  Patient Location: PACU  Anesthesia Type: General  Level of Consciousness: awake and alert   Airway and Oxygen Therapy: Patient Spontanous Breathing  Post-op Pain: mild  Post-op Assessment: Post-op Vital signs reviewed, Patient's Cardiovascular Status Stable, Respiratory Function Stable, Patent Airway and No signs of Nausea or vomiting  Last Vitals:  Filed Vitals:   08/26/13 1915  BP: 144/84  Pulse:   Temp: 36.4 C  Resp:     Post-op Vital Signs: stable   Complications: No apparent anesthesia complications. Nausea treated with multimodal therapy.

## 2013-08-26 NOTE — Op Note (Signed)
Pre-operative diagnosis- Osteoarthritis  Left knee(s)  Post-operative diagnosis- Osteoarthritis Left knee(s)  Procedure-  Left  Total Knee Arthroplasty (Attune system)  Surgeon- Paula Rankin. Zandra Lajeunesse, MD  Assistant- Avel Peace, PA-C   Anesthesia-  General EBL-* No blood loss amount entered *  Drains Hemovac  Tourniquet time-  Total Tourniquet Time Documented: Thigh (Left) - 40 minutes Total: Thigh (Left) - 40 minutes    Complications- None  Condition-PACU - hemodynamically stable.   Brief Clinical Note  Paula Potts is a 57 y.o. year old female with end stage OA of her left knee with progressively worsening pain and dysfunction. She has constant pain, with activity and at rest and significant functional deficits with difficulties even with ADLs. She has had extensive non-op management including analgesics, injections of cortisone and viscosupplements, and home exercise program, but remains in significant pain with significant dysfunction. Radiographs show bone on bone arthritis lateral and patellofemoral. She presents now for left Total Knee Arthroplasty.    Procedure in detail---   The patient is brought into the operating room and positioned supine on the operating table. After successful administration of  General,   a tourniquet is placed high on the  Left thigh(s) and the lower extremity is prepped and draped in the usual sterile fashion. Time out is performed by the operating team and then the  Left lower extremity is wrapped in Esmarch, knee flexed and the tourniquet inflated to 300 mmHg.       A midline incision is made with a ten blade through the subcutaneous tissue to the level of the extensor mechanism. A fresh blade is used to make a medial parapatellar arthrotomy. Soft tissue over the proximal medial tibia is subperiosteally elevated to the joint line with a knife and into the semimembranosus bursa with a Cobb elevator. Soft tissue over the proximal lateral tibia is elevated  with attention being paid to avoiding the patellar tendon on the tibial tubercle. The patella is everted, knee flexed 90 degrees and the ACL and PCL are removed. Findings are bone on bone lateral and patellofemoral with large global osteohytes.        The drill is used to create a starting hole in the distal femur and the canal is thoroughly irrigated with sterile saline to remove the fatty contents. The 5 degree Left  valgus alignment guide is placed into the femoral canal and the distal femoral cutting block is pinned to remove 6 mm off the distal femur. Resection is made with an oscillating saw.      The tibia is subluxed forward and the menisci are removed. The extramedullary alignment guide is placed referencing proximally at the medial aspect of the tibial tubercle and distally along the second metatarsal axis and tibial crest. The block is pinned to remove 2mm off the more deficient lateral  side. Resection is made with an oscillating saw. Size 5is the most appropriate size for the tibia and the proximal tibia is prepared with the modular drill and keel punch for that size.      The femoral sizing guide is placed and size 6 narrow is most appropriate. Rotation is marked off the epicondylar axis and confirmed by creating a rectangular flexion gap at 90 degrees. The size 6 cutting block is pinned in this rotation and the anterior, posterior and chamfer cuts are made with the oscillating saw. The intercondylar block is then placed and that cut is made.      Trial size 5 tibial component,  trial size 6 posterior stabilized femur and a 6  mm posterior stabilized rotating platform insert trial is placed. Full extension is achieved with excellent varus/valgus and anterior/posterior balance throughout full range of motion. The patella is everted and thickness measured to be 22  mm. Free hand resection is taken to 12 mm, a 35 template is placed, lug holes are drilled, trial patella is placed, and it tracks  normally. Osteophytes are removed off the posterior femur with the trial in place. All trials are removed and the cut bone surfaces prepared with pulsatile lavage. Cement is mixed and once ready for implantation, the size 5 tibial implant, size  6 narrow posterior stabilized femoral component, and the size 35 patella are cemented in place and the patella is held with the clamp. The trial insert is placed and the knee held in full extension. The Exparel (20 ml mixed with 30 ml saline) and .25% Bupivicaine, are injected into the extensor mechanism, posterior capsule, medial and lateral gutters and subcutaneous tissues.  All extruded cement is removed and once the cement is hard the permanent 6 mm posterior stabilized rotating platform insert is placed into the tibial tray.      The wound is copiously irrigated with saline solution and the extensor mechanism closed over a hemovac drain with #1 PDS suture. The tourniquet is released for a total tourniquet time of 40  minutes. Flexion against gravity is 140 degrees and the patella tracks normally. Subcutaneous tissue is closed with 2.0 vicryl and subcuticular with running 4.0 Monocryl. The incision is cleaned and dried and steri-strips and a bulky sterile dressing are applied. The limb is placed into a knee immobilizer and the patient is awakened and transported to recovery in stable condition.      Please note that a surgical assistant was a medical necessity for this procedure in order to perform it in a safe and expeditious manner. Surgical assistant was necessary to retract the ligaments and vital neurovascular structures to prevent injury to them and also necessary for proper positioning of the limb to allow for anatomic placement of the prosthesis.   Paula Rankin Whitten Andreoni, MD    08/26/2013, 5:19 PM

## 2013-08-26 NOTE — Progress Notes (Signed)
Reported con't nausea to dr Payton Doughty. Does not wish to further medicate.

## 2013-08-27 ENCOUNTER — Encounter (HOSPITAL_COMMUNITY): Payer: Self-pay | Admitting: Orthopedic Surgery

## 2013-08-27 LAB — CBC
Hemoglobin: 12 g/dL (ref 12.0–15.0)
MCH: 33.6 pg (ref 26.0–34.0)
MCHC: 33.9 g/dL (ref 30.0–36.0)
MCV: 99.2 fL (ref 78.0–100.0)
RDW: 12.1 % (ref 11.5–15.5)

## 2013-08-27 LAB — BASIC METABOLIC PANEL
BUN: 16 mg/dL (ref 6–23)
CO2: 29 mEq/L (ref 19–32)
Calcium: 9.1 mg/dL (ref 8.4–10.5)
Creatinine, Ser: 0.78 mg/dL (ref 0.50–1.10)
GFR calc Af Amer: >90 mL/min (ref >90)
GFR calc non Af Amer: 90 mL/min — ABNORMAL LOW (ref >90)
Glucose, Bld: 168 mg/dL — ABNORMAL HIGH (ref 70–99)
Potassium: 5.1 mEq/L (ref 3.7–5.3)
Sodium: 135 mEq/L — ABNORMAL LOW (ref 137–147)

## 2013-08-27 MED ORDER — ASPIRIN EC 325 MG PO TBEC: 325.0000 mg | DELAYED_RELEASE_TABLET | Freq: Two times a day (BID) | ORAL | Status: DC

## 2013-08-27 MED ORDER — TRAMADOL HCL 50 MG PO TABS: 50.0000 mg | ORAL_TABLET | Freq: Four times a day (QID) | ORAL | Status: DC | PRN

## 2013-08-27 MED ORDER — METHOCARBAMOL 500 MG PO TABS: 500.0000 mg | ORAL_TABLET | Freq: Four times a day (QID) | ORAL | Status: DC | PRN

## 2013-08-27 MED ORDER — HYDROMORPHONE HCL 2 MG PO TABS: 2.0000 mg | ORAL_TABLET | ORAL | Status: DC | PRN

## 2013-08-27 NOTE — Care Management Note (Signed)
    Page 1 of 1   08/27/2013     4:40:12 PM   CARE MANAGEMENT NOTE 08/27/2013  Patient:  Paula Potts, Paula Potts   Account Number:  [id number]  Date Initiated:  08/27/2013  Documentation initiated by:  Colleen Can  Subjective/Objective Assessment:   dx total left knee replacemnt     Action/Plan:   CM spoke with patient. Plans are for her to return to her home in [place] where pt's friends will be caregivers. She already has RW and commode. Genevieve Norlander will provide [facility] services.   Anticipated DC Date:  08/28/2013   Anticipated DC Plan:  HOME W HOME HEALTH SERVICES      DC Planning Services  CM consult      [facility] Choice  HOME HEALTH   Choice offered to / List presented to:  C-1 Patient        HH arranged  HH-2 PT      [facility] agency  [facility]   Status of service:  Completed, signed off Medicare Important Message given?   (If response is "NO", the following Medicare IM given date fields will be blank) Date Medicare IM given:   Date Additional Medicare IM given:    Discharge Disposition:  HOME W HOME HEALTH SERVICES  Per UR Regulation:    If discussed at Long Length of Stay Meetings, dates discussed:    Comments:

## 2013-08-27 NOTE — Progress Notes (Signed)
OT Cancellation Note  Patient Details Name: Paula Potts MRN: [id number] DOB: 1956/07/05   Cancelled Treatment:    Reason Eval/Treat Not Completed: OT screened, no needs identified, will sign off  Kayleen Alig, [place] Kung 08/27/2013, 11:39 AM

## 2013-08-27 NOTE — Progress Notes (Signed)
Physical Therapy Treatment Patient Details Name: Paula Potts MRN: [id number] DOB: 25-Apr-1956 Today's Date: 08/27/2013 Time: 1455-1511 PT Time Calculation (min): 16 min  PT Assessment / Plan / Recommendation  History of Present Illness     PT Comments     Follow Up Recommendations  Home health PT     Does the patient have the potential to tolerate intense rehabilitation     Barriers to Discharge        Equipment Recommendations  None recommended by PT    Recommendations for Other Services    Frequency 7X/week   Progress towards PT Goals Progress towards PT goals: Progressing toward goals  Plan Current plan remains appropriate    Precautions / Restrictions Precautions Precautions: Knee;Fall Required Braces or Orthoses: Knee Immobilizer - Left Knee Immobilizer - Left: Discontinue once straight leg raise with < 10 degree lag Restrictions Weight Bearing Restrictions: No Other Position/Activity Restrictions: WBAT   Pertinent Vitals/Pain 3/10; MEDS requested prior to CPM    Mobility  Bed Mobility Bed Mobility: Sit to Supine Sit to Supine: 5: Supervision Details for Bed Mobility Assistance: cues for sequence and use of R LE to self assist Transfers Transfers: Sit to Stand;Stand to Sit Sit to Stand: 4: Min guard Stand to Sit: 4: Min guard Details for Transfer Assistance: cues for LE management and use of UEs to self assist Ambulation/Gait Ambulation/Gait Assistance: 4: Min guard;5: Supervision Ambulation Distance (Feet): 140 Feet Assistive device: Rolling walker Ambulation/Gait Assistance Details: cues for posture and position from RW Gait Pattern: Step-to pattern;Decreased step length - right;Decreased step length - left;Shuffle;Trunk flexed Stairs: No    Exercises     PT Diagnosis:    PT Problem List:   PT Treatment Interventions:     PT Goals (current goals can now be found in the care plan section) Acute Rehab PT Goals Patient Stated Goal: Resume  previous lifestyle with decreased pain PT Goal Formulation: With patient Time For Goal Achievement: 09/04/13 Potential to Achieve Goals: Good  Visit Information  Last PT Received On: 08/27/13 Assistance Needed: +1    Subjective Data  Patient Stated Goal: Resume previous lifestyle with decreased pain   Cognition  Cognition Arousal/Alertness: Awake/alert Behavior During Therapy: WFL for tasks assessed/performed Overall Cognitive Status: Within Functional Limits for tasks assessed    Balance     End of Session PT - End of Session Equipment Utilized During Treatment: Gait belt;Left knee immobilizer Activity Tolerance: Patient tolerated treatment well Patient left: in bed;with call bell/phone within reach Nurse Communication: Mobility status;Patient requests pain meds CPM Left Knee CPM Left Knee: On   GP     [name] 08/27/2013, 3:51 PM

## 2013-08-27 NOTE — Progress Notes (Signed)
Utilization review completed.  

## 2013-08-27 NOTE — Progress Notes (Signed)
   Subjective: 1 Day Post-Op Procedure(s) (LRB): LEFT TOTAL KNEE ARTHROPLASTY (Left) Patient reports pain as mild.   Patient seen in rounds for Dr. Lequita Halt.  She is doing very well today. Patient is well, but has had some minor complaints of pain in the knee, requiring pain medications We will start therapy today.  Plan is to go Home after hospital stay.  Objective: Vital signs in last 24 hours: Temp:  [97.5 F (36.4 C)-99.2 F (37.3 C)] 99.2 F (37.3 C) (12/31 1326) Pulse Rate:  [72-117] 78 (12/31 1326) Resp:  [7-20] 18 (12/31 1326) BP: (109-173)/(62-96) 127/63 mmHg (12/31 1326) SpO2:  [98 %-100 %] 98 % (12/31 1326) Weight:  [56.785 kg (125 lb 3 oz)] 56.785 kg (125 lb 3 oz) (12/31 0100)  Intake/Output from previous day:  Intake/Output Summary (Last 24 hours) at 08/27/13 1647 Last data filed at 08/27/13 1327  Gross per 24 hour  Intake   2250 ml  Output   3015 ml  Net   -765 ml    Intake/Output this shift: Total I/O In: 600 [P.O.:600] Out: 1475 [Urine:1350; Drains:125]  Labs:  Recent Labs  08/27/13 0620  HGB 12.0    Recent Labs  08/27/13 0620  WBC 10.7*  RBC 3.57*  HCT 35.4*  PLT 268    Recent Labs  08/27/13 0620  NA 135*  K 5.1  CL 99  CO2 29  BUN 16  CREATININE 0.78  GLUCOSE 168*  CALCIUM 9.1   No results found for this basename: LABPT, INR,  in the last 72 hours  EXAM General - Patient is Alert, Appropriate and Oriented Extremity - Neurovascular intact Sensation intact distally Dorsiflexion/Plantar flexion intact Dressing - dressing C/D/I Motor Function - intact, moving foot and toes well on exam.  Hemovac pulled without difficulty.  Past Medical History  Diagnosis Date  . PONV (postoperative nausea and vomiting)   . Hypertension   . Arthritis   . DJD (degenerative joint disease)   . Scoliosis     mild lower back  . Osteoarthritis   . Depression   . Anxiety   . Osteopenia     Assessment/Plan: 1 Day Post-Op Procedure(s)  (LRB): LEFT TOTAL KNEE ARTHROPLASTY (Left) Principal Problem:   OA (osteoarthritis) of knee  Estimated body mass index is 22.89 kg/(m^2) as calculated from the following:   Height as of this encounter: 5\' 2"  (1.575 m).   Weight as of this encounter: 56.785 kg (125 lb 3 oz). Advance diet Up with therapy Plan for discharge tomorrow Discharge home with home health  DVT Prophylaxis - Lovenox Weight-Bearing as tolerated to left leg D/C O2 and Pulse OX and try on Room [address]  Patrica Duel 08/27/2013, 4:47 PM

## 2013-08-27 NOTE — Plan of Care (Signed)
Problem: Consults Goal: Diagnosis- Total Joint Replacement Left Total Knee replacement

## 2013-08-27 NOTE — Discharge Summary (Signed)
Physician Discharge Summary   Patient ID: Paula Potts MRN: [id number] DOB/AGE: Mar 29, 1956 57 y.o.  Admit date: 08/26/2013 Discharge date: 08/28/2013  Primary Diagnosis:  Osteoarthritis Left knee(s)  Admission Diagnoses:  Past Medical History  Diagnosis Date  . PONV (postoperative nausea and vomiting)   . Hypertension   . Arthritis   . DJD (degenerative joint disease)   . Scoliosis     mild lower back  . Osteoarthritis   . Depression   . Anxiety   . Osteopenia    Discharge Diagnoses:   Principal Problem:   OA (osteoarthritis) of knee Active Problems:   Hyponatremia   Postoperative anemia due to acute blood loss  Estimated body mass index is 22.89 kg/(m^2) as calculated from the following:   Height as of this encounter: 5\' 2"  (1.575 m).   Weight as of this encounter: 56.785 kg (125 lb 3 oz).  Procedure:  Procedure(s) (LRB): LEFT TOTAL KNEE ARTHROPLASTY (Left)   Consults: None  HPI: Paula Potts is a 57 y.o. year old female with end stage OA of her left knee with progressively worsening pain and dysfunction. She has constant pain, with activity and at rest and significant functional deficits with difficulties even with ADLs. She has had extensive non-op management including analgesics, injections of cortisone and viscosupplements, and home exercise program, but remains in significant pain with significant dysfunction. Radiographs show bone on bone arthritis lateral and patellofemoral. She presents now for left Total Knee Arthroplasty.   Laboratory Data: Admission on 08/26/2013, Discharged on 08/28/2013  Component Date Value Range Status  . WBC 08/27/2013 10.7* 4.0 - 10.5 K/uL Final  . RBC 08/27/2013 3.57* 3.87 - 5.11 MIL/uL Final  . Hemoglobin 08/27/2013 12.0  12.0 - 15.0 g/dL Final  . HCT 40/98/1191 35.4* 36.0 - 46.0 % Final  . MCV 08/27/2013 99.2  78.0 - 100.0 fL Final  . MCH 08/27/2013 33.6  26.0 - 34.0 pg Final  . MCHC 08/27/2013 33.9  30.0 - 36.0 g/dL Final    . RDW 47/82/9562 12.1  11.5 - 15.5 % Final  . Platelets 08/27/2013 268  150 - 400 K/uL Final  . Sodium 08/27/2013 135* 137 - 147 mEq/L Final   Please note change in reference range.  . Potassium 08/27/2013 5.1  3.7 - 5.3 mEq/L Final   Please note change in reference range.  . Chloride 08/27/2013 99  96 - 112 mEq/L Final  . CO2 08/27/2013 29  19 - 32 mEq/L Final  . Glucose, Bld 08/27/2013 168* 70 - 99 mg/dL Final  . BUN 13/03/6577 16  6 - 23 mg/dL Final  . Creatinine, Ser 08/27/2013 0.78  0.50 - 1.10 mg/dL Final  . Calcium 46/96/2952 9.1  8.4 - 10.5 mg/dL Final  . GFR calc non Af Amer 08/27/2013 90* >90 mL/min Final  . GFR calc Af Amer 08/27/2013 >90  >90 mL/min Final   Comment: (NOTE)                          The eGFR has been calculated using the CKD EPI equation.                          This calculation has not been validated in all clinical situations.                          eGFR's persistently <90 mL/min signify  possible Chronic Kidney                          Disease.  . WBC 08/28/2013 11.5* 4.0 - 10.5 K/uL Final  . RBC 08/28/2013 3.14* 3.87 - 5.11 MIL/uL Final  . Hemoglobin 08/28/2013 10.7* 12.0 - 15.0 g/dL Final  . HCT 45/40/9811 31.8* 36.0 - 46.0 % Final  . MCV 08/28/2013 101.3* 78.0 - 100.0 fL Final  . MCH 08/28/2013 34.1* 26.0 - 34.0 pg Final  . MCHC 08/28/2013 33.6  30.0 - 36.0 g/dL Final  . RDW 91/47/8295 12.6  11.5 - 15.5 % Final  . Platelets 08/28/2013 286  150 - 400 K/uL Final  . Sodium 08/28/2013 138  137 - 147 mEq/L Final   Please note change in reference range.  . Potassium 08/28/2013 4.1  3.7 - 5.3 mEq/L Final   Comment: Please note change in reference range.                          DELTA CHECK NOTED  . Chloride 08/28/2013 100  96 - 112 mEq/L Final  . CO2 08/28/2013 27  19 - 32 mEq/L Final  . Glucose, Bld 08/28/2013 118* 70 - 99 mg/dL Final  . BUN 62/13/0865 19  6 - 23 mg/dL Final  . Creatinine, Ser 08/28/2013 0.75  0.50 - 1.10 mg/dL Final  . Calcium  78/46/9629 9.1  8.4 - 10.5 mg/dL Final  . GFR calc non Af Amer 08/28/2013 >90  >90 mL/min Final  . GFR calc Af Amer 08/28/2013 >90  >90 mL/min Final   Comment: (NOTE)                          The eGFR has been calculated using the CKD EPI equation.                          This calculation has not been validated in all clinical situations.                          eGFR's persistently <90 mL/min signify possible Chronic Kidney                          Disease.  Hospital Outpatient Visit on 08/18/2013  Component Date Value Range Status  . MRSA, PCR 08/18/2013 NEGATIVE  NEGATIVE Final  . Staphylococcus aureus 08/18/2013 POSITIVE* NEGATIVE Final   Comment:                                 The Xpert SA Assay (FDA                          approved for NASAL specimens                          in patients over 75 years of age),                          is one component of                          a  comprehensive surveillance                          program.  Test performance has                          been validated by [facility] for patients greater                          than or equal to 93 year old.                          It is not intended                          to diagnose infection nor to                          guide or monitor treatment.  Marland Kitchen aPTT 08/18/2013 32  24 - 37 seconds Final  . WBC 08/18/2013 4.3  4.0 - 10.5 K/uL Final  . RBC 08/18/2013 4.26  3.87 - 5.11 MIL/uL Final  . Hemoglobin 08/18/2013 14.5  12.0 - 15.0 g/dL Final  . HCT 16/05/9603 42.2  36.0 - 46.0 % Final  . MCV 08/18/2013 99.1  78.0 - 100.0 fL Final  . MCH 08/18/2013 34.0  26.0 - 34.0 pg Final  . MCHC 08/18/2013 34.4  30.0 - 36.0 g/dL Final  . RDW 54/04/8118 12.1  11.5 - 15.5 % Final  . Platelets 08/18/2013 299  150 - 400 K/uL Final  . Sodium 08/18/2013 139  135 - 145 mEq/L Final  . Potassium 08/18/2013 4.1  3.5 - 5.1 mEq/L Final  . Chloride 08/18/2013 101  96 - 112 mEq/L Final    . CO2 08/18/2013 27  19 - 32 mEq/L Final  . Glucose, Bld 08/18/2013 92  70 - 99 mg/dL Final  . BUN 14/78/2956 28* 6 - 23 mg/dL Final  . Creatinine, Ser 08/18/2013 0.75  0.50 - 1.10 mg/dL Final  . Calcium 21/30/8657 9.7  8.4 - 10.5 mg/dL Final  . Total Protein 08/18/2013 7.1  6.0 - 8.3 g/dL Final  . Albumin 84/69/6295 4.2  3.5 - 5.2 g/dL Final  . AST 28/41/3244 23  0 - 37 U/L Final  . ALT 08/18/2013 18  0 - 35 U/L Final  . Alkaline Phosphatase 08/18/2013 92  39 - 117 U/L Final  . Total Bilirubin 08/18/2013 0.2* 0.3 - 1.2 mg/dL Final  . GFR calc non Af Amer 08/18/2013 >90  >90 mL/min Final  . GFR calc Af Amer 08/18/2013 >90  >90 mL/min Final   Comment: (NOTE)                          The eGFR has been calculated using the CKD EPI equation.                          This calculation has not been validated in all clinical situations.  eGFR's persistently <90 mL/min signify possible Chronic Kidney                          Disease.  Marland Kitchen Prothrombin Time 08/18/2013 11.7  11.6 - 15.2 seconds Final  . INR 08/18/2013 0.87  0.00 - 1.49 Final  . Color, Urine 08/18/2013 YELLOW  YELLOW Final  . APPearance 08/18/2013 CLEAR  CLEAR Final  . Specific Gravity, Urine 08/18/2013 1.023  1.005 - 1.030 Final  . pH 08/18/2013 6.0  5.0 - 8.0 Final  . Glucose, UA 08/18/2013 NEGATIVE  NEGATIVE mg/dL Final  . Hgb urine dipstick 08/18/2013 NEGATIVE  NEGATIVE Final  . Bilirubin Urine 08/18/2013 NEGATIVE  NEGATIVE Final  . Ketones, ur 08/18/2013 NEGATIVE  NEGATIVE mg/dL Final  . Protein, ur 09/81/1914 NEGATIVE  NEGATIVE mg/dL Final  . Urobilinogen, UA 08/18/2013 0.2  0.0 - 1.0 mg/dL Final  . Nitrite 78/29/5621 NEGATIVE  NEGATIVE Final  . Leukocytes, UA 08/18/2013 SMALL* NEGATIVE Final  . ABO/RH(D) 08/18/2013 O POS   Final  . Antibody Screen 08/18/2013 NEG   Final  . Sample Expiration 08/18/2013 08/29/2013   Final  . Squamous Epithelial / LPF 08/18/2013 RARE  RARE Final  . WBC, UA  08/18/2013 0-2  <3 WBC/hpf Final  . RBC / HPF 08/18/2013 0-2  <3 RBC/hpf Final  . Bacteria, UA 08/18/2013 RARE  RARE Final  . Crystals 08/18/2013 CA OXALATE CRYSTALS* NEGATIVE Final     X-Rays:No results found.  EKG: Orders placed during the hospital encounter of 05/14/12  . EKG 12-LEAD  . EKG 12-LEAD     Hospital Course: Karol Skarzynski Theisen is a 57 y.o. who was admitted to [facility]. They were brought to the operating room on 08/26/2013 and underwent Procedure(s): LEFT TOTAL KNEE ARTHROPLASTY.  Patient tolerated the procedure well and was later transferred to the recovery room and then to the orthopaedic floor for postoperative care.  They were given PO and IV analgesics for pain control following their surgery.  They were given 24 hours of postoperative antibiotics of  Anti-infectives   Start     Dose/Rate Route Frequency Ordered Stop   08/26/13 2230  ceFAZolin (ANCEF) IVPB 1 g/50 mL premix     1 g 100 mL/hr over 30 Minutes Intravenous Every 6 hours 08/26/13 2025 08/27/13 0410   08/26/13 1345  ceFAZolin (ANCEF) IVPB 2 g/50 mL premix     2 g 100 mL/hr over 30 Minutes Intravenous On call to O.R. 08/26/13 1334 08/26/13 1618     and started on DVT prophylaxis in the form of Lovenox.   PT and OT were ordered for total joint protocol.  Discharge planning consulted to help with postop disposition and equipment needs.  Patient had a good night on the evening of surgery.  They started to get up OOB with therapy on day one. Hemovac drain was pulled without difficulty.  Continued to work with therapy into day two.  Dressing was changed on day two and the incision was healing well.  Patient was seen in rounds and was ready to go home later that day.   Discharge Medications: Prior to Admission medications   Medication Sig Start Date End Date Taking? Authorizing Provider  calcium carbonate (OS-CAL) 600 MG TABS Take 1,200 mg by mouth daily.    Yes Historical Provider, MD  cholecalciferol  (VITAMIN D) 1000 UNITS tablet Take 1,000 Units by mouth daily.   Yes Historical Provider, MD  gabapentin (NEURONTIN)  300 MG capsule Take 300 mg by mouth 2 (two) times daily.    Yes Historical Provider, MD  lisinopril (PRINIVIL,ZESTRIL) 20 MG tablet Take 20 mg by mouth every morning.    Yes Historical Provider, MD  Multiple Vitamin (MULTIVITAMIN WITH MINERALS) TABS tablet Take 1 tablet by mouth daily.   Yes Historical Provider, MD  naproxen sodium (ANAPROX) 220 MG tablet Take 220 mg by mouth 2 (two) times daily with a meal.   Yes Historical Provider, MD  aspirin EC 325 MG tablet Take 1 tablet (325 mg total) by mouth 2 (two) times daily. Take twice a day for four weeks. 08/27/13   Damoni Erker, PA-C  HYDROmorphone (DILAUDID) 2 MG tablet Take 1-2 tablets (2-4 mg total) by mouth every 4 (four) hours as needed for severe pain. 08/27/13   Furkan Keenum Julien Girt, PA-C  methocarbamol (ROBAXIN) 500 MG tablet Take 1 tablet (500 mg total) by mouth every 6 (six) hours as needed for muscle spasms. 08/27/13   Shafin Pollio, PA-C  traMADol (ULTRAM) 50 MG tablet Take 1-2 tablets (50-100 mg total) by mouth every 6 (six) hours as needed for moderate pain. 08/27/13   Tamme Mozingo Julien Girt, PA-C    Diet: Cardiac diet Activity:WBAT Follow-up:in 2 weeks Disposition - Home Discharged Condition: good       Discharge Orders   Future Orders Complete By Expires   Call MD / Call 911  As directed    Comments:     If you experience chest pain or shortness of breath, CALL 911 and be transported to the hospital emergency room.  If you develope a fever above 101 F, pus (white drainage) or increased drainage or redness at the wound, or calf pain, call your surgeon's office.   Change dressing  As directed    Comments:     Change dressing daily with sterile 4 x 4 inch gauze dressing and apply TED hose. Do not submerge the incision under water.   Constipation Prevention  As directed    Comments:     Drink plenty of  fluids.  Prune juice may be helpful.  You may use a stool softener, such as Colace (over the counter) 100 mg twice a day.  Use MiraLax (over the counter) for constipation as needed.   Diet - low sodium heart healthy  As directed    Discharge instructions  As directed    Comments:     Pick up stool softner and laxative for home. Do not submerge incision under water. May shower. Continue to use ice for pain and swelling from surgery.  Take a 325 mg Aspirin twice a day for four weeks.   Do not put a pillow under the knee. Place it under the heel.  As directed    Do not sit on low chairs, stoools or toilet seats, as it may be difficult to get up from low surfaces  As directed    Driving restrictions  As directed    Comments:     No driving until released by the physician.   Increase activity slowly as tolerated  As directed    Lifting restrictions  As directed    Comments:     No lifting until released by the physician.   Patient may shower  As directed    Comments:     You may shower without a dressing once there is no drainage.  Do not wash over the wound.  If drainage remains, do not shower until drainage stops.  TED hose  As directed    Comments:     Use stockings (TED hose) for 3 weeks on both leg(s).  You may remove them at night for sleeping.   Weight bearing as tolerated  As directed    Questions:     Laterality:     Extremity:         Medication List    STOP taking these medications       calcium carbonate 600 MG Tabs tablet  Commonly known as:  OS-CAL     cholecalciferol 1000 UNITS tablet  Commonly known as:  VITAMIN D     multivitamin with minerals Tabs tablet     naproxen sodium 220 MG tablet  Commonly known as:  ANAPROX      TAKE these medications       aspirin EC 325 MG tablet  Take 1 tablet (325 mg total) by mouth 2 (two) times daily. Take twice a day for four weeks.     gabapentin 300 MG capsule  Commonly known as:  NEURONTIN  Take 300 mg by mouth 2  (two) times daily.     HYDROmorphone 2 MG tablet  Commonly known as:  DILAUDID  Take 1-2 tablets (2-4 mg total) by mouth every 4 (four) hours as needed for severe pain.     lisinopril 20 MG tablet  Commonly known as:  PRINIVIL,ZESTRIL  Take 20 mg by mouth every morning.     methocarbamol 500 MG tablet  Commonly known as:  ROBAXIN  Take 1 tablet (500 mg total) by mouth every 6 (six) hours as needed for muscle spasms.     traMADol 50 MG tablet  Commonly known as:  ULTRAM  Take 1-2 tablets (50-100 mg total) by mouth every 6 (six) hours as needed for moderate pain.       Follow-up Information   Follow up with Loanne Drilling, MD. Schedule an appointment as soon as possible for a visit in 2 weeks.   Specialty:  Orthopedic Surgery   Contact information:   [address] [place] [address] [phone]       Follow up with [facility]. [facility])    Contact information:   [address] [place] [address] [phone]       Signed: Patrica Duel 09/11/2013, 10:17 AM

## 2013-08-27 NOTE — Evaluation (Signed)
Physical Therapy Evaluation Patient Details Name: Paula Potts MRN: [id number] DOB: [birth year] Today's Date: 08/27/2013 Time: 7829-5621 PT Time Calculation (min): 30 min  PT Assessment / Plan / Recommendation History of Present Illness     Clinical Impression  Pt s/p L TKR presents with decreased L LE strength/ROM and post op pain limiting functional mobility.  Pt should progress well to d/c home with family assist.    PT Assessment       Follow Up Recommendations  Home health PT    Does the patient have the potential to tolerate intense rehabilitation      Barriers to Discharge        Equipment Recommendations  None recommended by PT    Recommendations for Other Services     Frequency      Precautions / Restrictions Precautions Precautions: Knee;Fall Required Braces or Orthoses: Knee Immobilizer - Left Knee Immobilizer - Left: Discontinue once straight leg raise with < 10 degree lag Restrictions Weight Bearing Restrictions: No Other Position/Activity Restrictions: WBAT   Pertinent Vitals/Pain 5/10; premed, cold packs provided      Mobility  Bed Mobility Bed Mobility: Supine to Sit Supine to Sit: 4: Min assist Details for Bed Mobility Assistance: cues for sequence and use of R LE to self assist Transfers Transfers: Sit to Stand;Stand to Sit Sit to Stand: 4: Min assist Stand to Sit: 4: Min assist Details for Transfer Assistance: cues for LE management and use of UEs to self assist Ambulation/Gait Ambulation/Gait Assistance: 4: Min assist Ambulation Distance (Feet): 56 Feet Assistive device: Rolling walker Ambulation/Gait Assistance Details: cues for sequence, posture and position from RW Gait Pattern: Step-to pattern;Decreased step length - right;Decreased step length - left;Shuffle;Trunk flexed Stairs: No    Exercises Total Joint Exercises Ankle Circles/Pumps: AROM;15 reps;Supine;Both Quad Sets: AROM;Both;10 reps;Supine Heel Slides: AAROM;Left;10  reps;Supine Straight Leg Raises: AAROM;Left;10 reps;Supine   PT Diagnosis:    PT Problem List:   PT Treatment Interventions:       PT Goals(Current goals can be found in the care plan section) Acute Rehab PT Goals Patient Stated Goal: Resume previous lifestyle with decreased pain PT Goal Formulation: With patient Time For Goal Achievement: 09/04/13 Potential to Achieve Goals: Good  Visit Information  Last PT Received On: 08/27/13 Assistance Needed: +1       Prior Functioning  Home Living Family/patient expects to be discharged to:: Private residence Living Arrangements: Alone Available Help at Discharge: Available PRN/intermittently Type of Home: House Home Access: Stairs to enter [employer] of Steps: 2 Entrance Stairs-Rails: Can reach both Home Layout: 1/2 bath on main level;Two level Alternate Level Stairs-Number of Steps: 14 Alternate Level Stairs-Rails: Right Home Equipment: Walker - 2 wheels Prior Function Level of Independence: Independent Comments: recently laid off Communication Communication: No difficulties Dominant Hand: Left    Cognition  Cognition Arousal/Alertness: Awake/alert Behavior During Therapy: WFL for tasks assessed/performed Overall Cognitive Status: Within Functional Limits for tasks assessed    Extremity/Trunk Assessment Upper Extremity Assessment Upper Extremity Assessment: Overall WFL for tasks assessed Lower Extremity Assessment Lower Extremity Assessment: LLE deficits/detail LLE Deficits / Details: 2+/5 quads with AAROM at knee -10 - 40 pain limited   Balance    End of Session PT - End of Session Equipment Utilized During Treatment: Gait belt;Left knee immobilizer Activity Tolerance: Patient tolerated treatment well;Patient limited by pain Patient left: in chair;with call bell/phone within reach;with nursing/sitter in room Nurse Communication: Mobility status;Patient requests pain meds  GP  Dontasia Miranda 08/27/2013, 12:59 PM

## 2013-08-28 LAB — CBC
HCT: 31.8 % — ABNORMAL LOW (ref 36.0–46.0)
Hemoglobin: 10.7 g/dL — ABNORMAL LOW (ref 12.0–15.0)
MCH: 34.1 pg — ABNORMAL HIGH (ref 26.0–34.0)
MCHC: 33.6 g/dL (ref 30.0–36.0)
MCV: 101.3 fL — ABNORMAL HIGH (ref 78.0–100.0)
Platelets: 286 10*3/uL (ref 150–400)
RBC: 3.14 MIL/uL — ABNORMAL LOW (ref 3.87–5.11)
RDW: 12.6 % (ref 11.5–15.5)
WBC: 11.5 10*3/uL — ABNORMAL HIGH (ref 4.0–10.5)

## 2013-08-28 LAB — BASIC METABOLIC PANEL
BUN: 19 mg/dL (ref 6–23)
CO2: 27 mEq/L (ref 19–32)
Calcium: 9.1 mg/dL (ref 8.4–10.5)
Chloride: 100 mEq/L (ref 96–112)
Creatinine, Ser: 0.75 mg/dL (ref 0.50–1.10)
GFR calc Af Amer: >90 mL/min (ref >90)
GFR calc non Af Amer: >90 mL/min (ref >90)
Glucose, Bld: 118 mg/dL — ABNORMAL HIGH (ref 70–99)
Potassium: 4.1 mEq/L (ref 3.7–5.3)
Sodium: 138 mEq/L (ref 137–147)

## 2013-08-28 MED ORDER — LIP MEDEX EX OINT
TOPICAL_OINTMENT | CUTANEOUS | Status: AC
  Administered 2013-08-28: 12:00:00
  Filled 2013-08-28: qty 7

## 2013-08-28 NOTE — Progress Notes (Signed)
Physical Therapy Treatment Patient Details Name: Paula Potts MRN: [id number] DOB: 01/02/1956 Today's Date: 08/28/2013 Time: 1110-1200 PT Time Calculation (min): 50 min  PT Assessment / Plan / Recommendation  History of Present Illness     PT Comments   Pt to/from bathroom, up/down stairs, ambulating in hall and reviewed don/doff KI.  Multiple rests 2* intermittent nausea.  RN aware  Follow Up Recommendations  Home health PT     Does the patient have the potential to tolerate intense rehabilitation     Barriers to Discharge        Equipment Recommendations  None recommended by PT    Recommendations for Other Services    Frequency 7X/week   Progress towards PT Goals Progress towards PT goals: Progressing toward goals  Plan Current plan remains appropriate    Precautions / Restrictions Precautions Precautions: Knee;Fall Required Braces or Orthoses: Knee Immobilizer - Left Knee Immobilizer - Left: Discontinue once straight leg raise with < 10 degree lag Restrictions Weight Bearing Restrictions: No Other Position/Activity Restrictions: WBAT   Pertinent Vitals/Pain 6-7/10; ice packs provided, RN aware    Mobility  Bed Mobility Bed Mobility: Supine to Sit Supine to Sit: 5: Supervision Details for Bed Mobility Assistance: cues for sequence and use of R LE to self assist Transfers Transfers: Sit to Stand;Stand to Sit Sit to Stand: 5: Supervision;From bed;From chair/3-in-1 Stand to Sit: 5: Supervision;To bed;To chair/3-in-1 Details for Transfer Assistance: cues for LE management and use of UEs to self assist Ambulation/Gait Ambulation/Gait Assistance: 4: Min guard;5: Supervision Ambulation Distance (Feet): 60 Feet (twice and 20' twice) Assistive device: Rolling walker Ambulation/Gait Assistance Details: cues for posture and position from RW Gait Pattern: Step-to pattern;Shuffle;Trunk flexed Stairs: Yes Stairs Assistance: 4: Min assist Stairs Assistance Details  (indicate cue type and reason): cues for sequence and foot/crutch/cane placement Stair Management Technique: One rail Right;Two rails;Step to pattern;Forwards;With crutches;With cane Number of Stairs: 10 (4 with crutch/rail; 4 with cane/rail; 2 with Bil rails)    Exercises Total Joint Exercises Ankle Circles/Pumps: AROM;15 reps;Supine;Both Quad Sets: AROM;Both;10 reps;Supine Heel Slides: AAROM;Left;Supine;20 reps Straight Leg Raises: AAROM;Left;Supine;AROM;20 reps   PT Diagnosis:    PT Problem List:   PT Treatment Interventions:     PT Goals (current goals can now be found in the care plan section) Acute Rehab PT Goals Patient Stated Goal: Resume previous lifestyle with decreased pain PT Goal Formulation: With patient Time For Goal Achievement: 09/04/13 Potential to Achieve Goals: Good  Visit Information  Last PT Received On: 08/28/13 Assistance Needed: +1    Subjective Data  Subjective: C/o intermittant nausea Patient Stated Goal: Resume previous lifestyle with decreased pain   Cognition  Cognition Arousal/Alertness: Awake/alert Behavior During Therapy: WFL for tasks assessed/performed Overall Cognitive Status: Within Functional Limits for tasks assessed    Balance     End of Session PT - End of Session Equipment Utilized During Treatment: Gait belt;Left knee immobilizer Activity Tolerance: Other (comment) (ltd by intermittant nausea) Patient left: in chair;with call bell/phone within reach Nurse Communication: Mobility status;Patient requests pain meds   GP     Paula Potts 08/28/2013, 12:51 PM

## 2013-08-28 NOTE — Progress Notes (Addendum)
08/28/2013 1200 Has [employer] contact info. Has DME at home.  Isidoro DonningAlesia Zerick Prevette RN CCM Case Mgmt phone [phone]

## 2013-08-28 NOTE — Progress Notes (Signed)
Subjective: 2 Days Post-Op Procedure(s) (LRB): LEFT TOTAL KNEE ARTHROPLASTY (Left) Patient reports pain as 3 on 0-10 scale. Doing well. Dressing changed and wound looks fine.   Objective: Vital signs in last 24 hours: Temp:  [97.6 F (36.4 C)-99.2 F (37.3 C)] 97.6 F (36.4 C) (01/01 0552) Pulse Rate:  [76-83] 83 (01/01 0552) Resp:  [18] 18 (01/01 0552) BP: (115-131)/(62-86) 131/76 mmHg (01/01 0552) SpO2:  [98 %-100 %] 100 % (01/01 0552)  Intake/Output from previous day: 12/31 0701 - 01/01 0700 In: 840 [P.O.:840] Out: 2325 [Urine:2200; Drains:125] Intake/Output this shift: Total I/O In: 240 [P.O.:240] Out: -    Recent Labs  08/27/13 0620 08/28/13 0524  HGB 12.0 10.7*    Recent Labs  08/27/13 0620 08/28/13 0524  WBC 10.7* 11.5*  RBC 3.57* 3.14*  HCT 35.4* 31.8*  PLT 268 286    Recent Labs  08/27/13 0620 08/28/13 0524  NA 135* 138  K 5.1 4.1  CL 99 100  CO2 29 27  BUN 16 19  CREATININE 0.78 0.75  GLUCOSE 168* 118*  CALCIUM 9.1 9.1   No results found for this basename: LABPT, INR,  in the last 72 hours  Neurologically intact No cellulitis present  Assessment/Plan: 2 Days Post-Op Procedure(s) (LRB): LEFT TOTAL KNEE ARTHROPLASTY (Left) Discharge home with home health  Angelus Hoopes A 08/28/2013, 9:32 AM

## 2013-08-28 NOTE — Progress Notes (Signed)
Physical Therapy Treatment Patient Details Name: Jearld LeschMary K France MRN: [id number] DOB: 10/24/1955 Today's Date: 08/28/2013 Time: [id number] PT Time Calculation (min): 19 min  PT Assessment / Plan / Recommendation  History of Present Illness     PT Comments   OOB deferred 2* increased pain with therex  Follow Up Recommendations  Home health PT     Does the patient have the potential to tolerate intense rehabilitation     Barriers to Discharge        Equipment Recommendations  None recommended by PT    Recommendations for Other Services    Frequency 7X/week   Progress towards PT Goals Progress towards PT goals: Progressing toward goals  Plan Current plan remains appropriate    Precautions / Restrictions Precautions Precautions: Knee;Fall Required Braces or Orthoses: Knee Immobilizer - Left Knee Immobilizer - Left: Discontinue once straight leg raise with < 10 degree lag Restrictions Weight Bearing Restrictions: No Other Position/Activity Restrictions: WBAT   Pertinent Vitals/Pain 9/10; premed, cold packs provided    Mobility       Exercises Total Joint Exercises Ankle Circles/Pumps: AROM;15 reps;Supine;Both Quad Sets: AROM;Both;10 reps;Supine Heel Slides: AAROM;Left;Supine;20 reps Straight Leg Raises: AAROM;Left;Supine;AROM;20 reps   PT Diagnosis:    PT Problem List:   PT Treatment Interventions:     PT Goals (current goals can now be found in the care plan section) Acute Rehab PT Goals Patient Stated Goal: Resume previous lifestyle with decreased pain PT Goal Formulation: With patient Time For Goal Achievement: 09/04/13 Potential to Achieve Goals: Good  Visit Information  Last PT Received On: 08/28/13 Assistance Needed: +1    Subjective Data  Subjective: It hurts a lot more than yesterday Patient Stated Goal: Resume previous lifestyle with decreased pain   Cognition  Cognition Arousal/Alertness: Awake/alert Behavior During Therapy: WFL for tasks  assessed/performed Overall Cognitive Status: Within Functional Limits for tasks assessed    Balance     End of Session PT - End of Session Equipment Utilized During Treatment: Gait belt;Left knee immobilizer Activity Tolerance: Patient tolerated treatment well Patient left: in bed;with call bell/phone within reach Nurse Communication: Mobility status;Patient requests pain meds   GP     Issiah Huffaker 08/28/2013, 12:40 PM

## 2013-08-29 NOTE — Progress Notes (Signed)
Discharge summary sent to payer through [facility]  

## 2013-09-11 DIAGNOSIS — E871 Hypo-osmolality and hyponatremia: Secondary | ICD-10-CM

## 2013-09-11 DIAGNOSIS — D62 Acute posthemorrhagic anemia: Secondary | ICD-10-CM

## 2013-11-14 ENCOUNTER — Encounter (HOSPITAL_COMMUNITY): Payer: Self-pay

## 2013-11-19 ENCOUNTER — Encounter (HOSPITAL_COMMUNITY)
Admission: RE | Admit: 2013-11-19 | Discharge: 2013-11-19 | Disposition: A | Discharge status: Home / Self Care | Payer: BC Managed Care – PPO | Source: Ambulatory Visit | Attending: Orthopedic Surgery | Admitting: Orthopedic Surgery

## 2013-11-19 ENCOUNTER — Encounter (HOSPITAL_COMMUNITY): Payer: Self-pay

## 2013-11-19 ENCOUNTER — Ambulatory Visit (HOSPITAL_COMMUNITY)
Admission: RE | Admit: 2013-11-19 | Discharge: 2013-11-19 | Disposition: A | Discharge status: Home / Self Care | Payer: BC Managed Care – PPO | Source: Ambulatory Visit | Attending: Anesthesiology | Admitting: Anesthesiology

## 2013-11-19 DIAGNOSIS — Z01810 Encounter for preprocedural cardiovascular examination: Secondary | ICD-10-CM | POA: Insufficient documentation

## 2013-11-19 DIAGNOSIS — Z01812 Encounter for preprocedural laboratory examination: Secondary | ICD-10-CM | POA: Insufficient documentation

## 2013-11-19 DIAGNOSIS — Z01818 Encounter for other preprocedural examination: Secondary | ICD-10-CM | POA: Insufficient documentation

## 2013-11-19 LAB — BASIC METABOLIC PANEL
BUN: 29 mg/dL — ABNORMAL HIGH (ref 6–23)
CHLORIDE: 101 meq/L (ref 96–112)
CO2: 29 meq/L (ref 19–32)
CREATININE: 0.72 mg/dL (ref 0.50–1.10)
Calcium: 9.8 mg/dL (ref 8.4–10.5)
GFR calc non Af Amer: >90 mL/min (ref >90)
Glucose, Bld: 87 mg/dL (ref 70–99)
Potassium: 4.2 mEq/L (ref 3.7–5.3)
SODIUM: 142 meq/L (ref 137–147)

## 2013-11-19 LAB — CBC
HEMATOCRIT: 42 % (ref 36.0–46.0)
Hemoglobin: 14.7 g/dL (ref 12.0–15.0)
MCH: 34.3 pg — ABNORMAL HIGH (ref 26.0–34.0)
MCHC: 35 g/dL (ref 30.0–36.0)
MCV: 98.1 fL (ref 78.0–100.0)
PLATELETS: 290 10*3/uL (ref 150–400)
RBC: 4.28 MIL/uL (ref 3.87–5.11)
RDW: 12.3 % (ref 11.5–15.5)
WBC: 5.4 10*3/uL (ref 4.0–10.5)

## 2013-11-19 LAB — TYPE AND SCREEN
ABO/RH(D): O POS
ANTIBODY SCREEN: NEGATIVE

## 2013-11-19 NOTE — Pre-Procedure Instructions (Addendum)
Paula Potts  11/19/2013   Your procedure is scheduled on:  11/27/13  Report to [facility] short stay admitting at 1130 AM.  Call this number if you have problems the morning of surgery: [phone]   Remember:   Do not eat food or drink liquids after midnight.   Take these medicines the morning of surgery with A SIP OF WATER: gabapentin              STOP all herbel meds, nsaids (aleve,naproxen,advil,ibuprofen) 5 days prior to surgery including aspirin,vitamins,coq10   Do not wear jewelry, make-up or nail polish.  Do not wear lotions, powders, or perfumes. You may wear deodorant.  Do not shave 48 hours prior to surgery. Men may shave face and neck.  Do not bring valuables to the hospital.  [facility] is not responsible                  for any belongings or valuables.               Contacts, dentures or bridgework may not be worn into surgery.  Leave suitcase in the car. After surgery it may be brought to your room.  For patients admitted to the hospital, discharge time is determined by your                treatment team.               Patients discharged the day of surgery will not be allowed to drive  home.  Name and phone number of your driver:   Special Instructions:  Special Instructions: [place] - Preparing for Surgery  Before surgery, you can play an important role.  Because skin is not sterile, your skin needs to be as free of germs as possible.  You can reduce the number of germs on you skin by washing with CHG (chlorahexidine gluconate) soap before surgery.  CHG is an antiseptic cleaner which kills germs and bonds with the skin to continue killing germs even after washing.  Please DO NOT use if you have an allergy to CHG or antibacterial soaps.  If your skin becomes reddened/irritated stop using the CHG and inform your nurse when you arrive at Short Stay.  Do not shave (including legs and underarms) for at least 48 hours prior to the first CHG shower.  You may shave  your face.  Please follow these instructions carefully:   1.  Shower with CHG Soap the night before surgery and the morning of Surgery.  2.  If you choose to wash your hair, wash your hair first as usual with your normal shampoo.  3.  After you shampoo, rinse your hair and body thoroughly to remove the Shampoo.  4.  Use CHG as you would any other liquid soap.  You can apply chg directly  to the skin and wash gently with scrungie or a clean washcloth.  5.  Apply the CHG Soap to your body ONLY FROM THE NECK DOWN.  Do not use on open wounds or open sores.  Avoid contact with your eyes ears, mouth and genitals (private parts).  Wash genitals (private parts)       with your normal soap.  6.  Wash thoroughly, paying special attention to the area where your surgery will be performed.  7.  Thoroughly rinse your body with warm water from the neck down.  8.  DO NOT shower/wash with your normal soap after using and rinsing off  the CHG Soap.  9.  Pat yourself dry with a clean towel.            10.  Wear clean pajamas.            11.  Place clean sheets on your bed the night of your first shower and do not sleep with pets.  Day of Surgery  Do not apply any lotions/deodorants the morning of surgery.  Please wear clean clothes to the hospital/surgery center.   Please read over the following fact sheets that you were given: Pain Booklet, Coughing and Deep Breathing, Blood Transfusion Information and Surgical Site Infection Prevention

## 2013-11-19 NOTE — Progress Notes (Signed)
No orders. Office called by KeySpanericka

## 2013-11-19 NOTE — Progress Notes (Signed)
req'd ekg from dr Consuella Loseelaine griffin Also stress from dr skains(09?) notes
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
## 2013-11-21 NOTE — Progress Notes (Signed)
[facility] and requested orders be put in computer.

## 2013-11-26 ENCOUNTER — Other Ambulatory Visit: Payer: Self-pay | Admitting: Orthopedic Surgery

## 2013-11-26 MED ORDER — CHLORHEXIDINE GLUCONATE 4 % EX LIQD
60.0000 mL | Freq: Once | CUTANEOUS | Status: DC
  Filled 2013-11-26: qty 60

## 2013-11-26 MED ORDER — SODIUM CHLORIDE 0.9 % IV SOLN
INTRAVENOUS | Status: DC
  Administered 2013-11-27: 100 mL/h via INTRAVENOUS

## 2013-11-26 MED ORDER — CEFAZOLIN SODIUM-DEXTROSE 2-3 GM-% IV SOLR
2.0000 g | INTRAVENOUS | Status: AC
  Administered 2013-11-27: 2 g via INTRAVENOUS
  Filled 2013-11-26: qty 50

## 2013-11-26 NOTE — H&P (Signed)
Paula Potts is an 58 y.o. female.   Chief Complaint: Pain in left ankle  HPI: Pt reports today to OR for left ankle replacement.  Pt has failed conservative treatment for her left ankle arthritis. Pt denies N/V/F/C, chest pain, SOB, calf pain or paresthesia b/l.  Past Medical History  Diagnosis Date  . PONV (postoperative nausea and vomiting)   . Hypertension   . Arthritis   . DJD (degenerative joint disease)   . Scoliosis     mild lower back  . Osteoarthritis   . Osteopenia   . Depression     denies  . Anxiety     denies    Past Surgical History  Procedure Laterality Date  . Total hip arthroplasty  2000    right  . Total hip arthroplasty  2010    left  . Cosmetic surgery  2006    tummy tuck  . Breast enhancement surgery  99  . Knee arthroscopy  1993    left  . Total shoulder arthroplasty  2012    right-dsc  . Total shoulder arthroplasty  05/14/2012    Procedure: TOTAL SHOULDER ARTHROPLASTY;lft Surgeon: Wyn Forster., MD;  Location: [facility];  Service: Orthopedics;  Laterality: Left;  Biceps Tenodesis also  . Total ankle arthroplasty Right 11/14/2012    Procedure: TOTAL ANKLE ARTHOPLASTY;  Surgeon: Toni Arthurs, MD;  Location: [facility] OR;  Service: Orthopedics;  Laterality: Right;  . Gastrocnemius recession Right 11/14/2012    Procedure: GASTROCNEMIUS RECESSION;  Surgeon: Toni Arthurs, MD;  Location: [facility] OR;  Service: Orthopedics;  Laterality: Right;  . Cesarean section  1995  . Total knee arthroplasty Left 08/26/2013    Procedure: LEFT TOTAL KNEE ARTHROPLASTY;  Surgeon: Loanne Drilling, MD;  Location: [facility] ORS;  Service: Orthopedics;  Laterality: Left;  . Breast surgery      No family history on file. Social History:  reports that she has never smoked. She has never used smokeless tobacco. She reports that she drinks alcohol. She reports that she does not use illicit drugs.  Allergies:  Allergies  Allergen Reactions  . Codeine Nausea And Vomiting  .  Hydrocodone Nausea And Vomiting  . Morphine And Related Nausea And Vomiting  . Oxycodone Nausea And Vomiting    No prescriptions prior to admission    No results found for this or any previous visit (from the past 48 hour(s)). No results found.  Review of Systems  Constitutional: Negative.   HENT: Negative.   Eyes: Negative.   Respiratory: Negative.   Cardiovascular: Negative.   Gastrointestinal: Negative.   Musculoskeletal: Negative.   Skin: Negative.   Neurological: Negative for tremors.  Endo/Heme/Allergies: Negative.   Psychiatric/Behavioral: The patient is not nervous/anxious.     There were no vitals taken for this visit. Physical Exam  WD WN 57y/o female in NAD, A/Ox3, appears stated age. EOMI, mood and affect normal respirations unlabored.   Gait is heel toe reciprocal b/l with antalgia to the left. Strength 5/5 with DF, PF, inversion and eversion of the left ankle.  Normal sensation to light touch intact, DP pulse 2+ b/l. Distal toes well perfused with cap refill <2sec.  Assessment/Plan Pt reports to OR for left total ankle arthroplasty.   FLOWERS, CHRISTOPHER S 11/26/2013, 1:35 PM  Pt seen and examined.  Agree with note above.  To OR for left total ankle replacmenet.  The risks and benefits of the alternative treatment options have been discussed in detail.  The patient wishes to proceed with surgery and specifically understands risks of bleeding, infection, nerve damage, blood clots, need for additional surgery, amputation and death.

## 2013-11-27 ENCOUNTER — Encounter (HOSPITAL_COMMUNITY)
Admission: RE | Disposition: A | Discharge status: Home / Self Care | Payer: Self-pay | Source: Ambulatory Visit | Attending: Orthopedic Surgery

## 2013-11-27 ENCOUNTER — Inpatient Hospital Stay (HOSPITAL_COMMUNITY)
Admission: RE | Admit: 2013-11-27 | Discharge: 2013-11-28 | DRG: 470 | Disposition: A | Discharge status: Home / Self Care | Payer: BC Managed Care – PPO | Source: Ambulatory Visit | Attending: Orthopedic Surgery | Admitting: Orthopedic Surgery

## 2013-11-27 ENCOUNTER — Encounter (HOSPITAL_COMMUNITY): Payer: BC Managed Care – PPO | Admitting: Vascular Surgery

## 2013-11-27 ENCOUNTER — Encounter (HOSPITAL_COMMUNITY): Payer: Self-pay | Admitting: *Deleted

## 2013-11-27 ENCOUNTER — Inpatient Hospital Stay (HOSPITAL_COMMUNITY): Payer: BC Managed Care – PPO | Admitting: Anesthesiology

## 2013-11-27 ENCOUNTER — Inpatient Hospital Stay (HOSPITAL_COMMUNITY): Payer: BC Managed Care – PPO

## 2013-11-27 DIAGNOSIS — M1990 Unspecified osteoarthritis, unspecified site: Secondary | ICD-10-CM | POA: Diagnosis present

## 2013-11-27 DIAGNOSIS — Z96649 Presence of unspecified artificial hip joint: Secondary | ICD-10-CM

## 2013-11-27 DIAGNOSIS — M4120 Other idiopathic scoliosis, site unspecified: Secondary | ICD-10-CM | POA: Diagnosis present

## 2013-11-27 DIAGNOSIS — M19079 Primary osteoarthritis, unspecified ankle and foot: Principal | ICD-10-CM | POA: Diagnosis present

## 2013-11-27 DIAGNOSIS — Z885 Allergy status to narcotic agent status: Secondary | ICD-10-CM

## 2013-11-27 DIAGNOSIS — Z96669 Presence of unspecified artificial ankle joint: Secondary | ICD-10-CM

## 2013-11-27 DIAGNOSIS — I10 Essential (primary) hypertension: Secondary | ICD-10-CM | POA: Diagnosis present

## 2013-11-27 DIAGNOSIS — M6240 Contracture of muscle, unspecified site: Secondary | ICD-10-CM | POA: Diagnosis present

## 2013-11-27 DIAGNOSIS — F411 Generalized anxiety disorder: Secondary | ICD-10-CM | POA: Diagnosis present

## 2013-11-27 DIAGNOSIS — M899 Disorder of bone, unspecified: Secondary | ICD-10-CM | POA: Diagnosis present

## 2013-11-27 DIAGNOSIS — F3289 Other specified depressive episodes: Secondary | ICD-10-CM | POA: Diagnosis present

## 2013-11-27 DIAGNOSIS — M949 Disorder of cartilage, unspecified: Secondary | ICD-10-CM

## 2013-11-27 DIAGNOSIS — M19072 Primary osteoarthritis, left ankle and foot: Secondary | ICD-10-CM | POA: Diagnosis present

## 2013-11-27 DIAGNOSIS — F329 Major depressive disorder, single episode, unspecified: Secondary | ICD-10-CM | POA: Diagnosis present

## 2013-11-27 DIAGNOSIS — Z888 Allergy status to other drugs, medicaments and biological substances status: Secondary | ICD-10-CM

## 2013-11-27 DIAGNOSIS — Z96619 Presence of unspecified artificial shoulder joint: Secondary | ICD-10-CM

## 2013-11-27 HISTORY — PX: TOTAL ANKLE ARTHROPLASTY: SHX811

## 2013-11-27 HISTORY — PX: TOTAL ANKLE REPLACEMENT: SUR1218

## 2013-11-27 SURGERY — ARTHROPLASTY, ANKLE, TOTAL
Anesthesia: Regional | Site: Ankle | Laterality: Left

## 2013-11-27 MED ORDER — METOCLOPRAMIDE HCL 10 MG PO TABS: 5.0000 mg | ORAL_TABLET | Freq: Three times a day (TID) | ORAL | Status: DC | PRN

## 2013-11-27 MED ORDER — ONDANSETRON HCL 4 MG/2ML IJ SOLN
INTRAMUSCULAR | Status: AC
  Filled 2013-11-27: qty 2

## 2013-11-27 MED ORDER — HYDROMORPHONE HCL 2 MG PO TABS: 2.0000 mg | ORAL_TABLET | ORAL | Status: DC | PRN

## 2013-11-27 MED ORDER — LISINOPRIL 20 MG PO TABS
20.0000 mg | ORAL_TABLET | Freq: Every morning | ORAL | Status: DC
  Filled 2013-11-27: qty 1

## 2013-11-27 MED ORDER — FENTANYL CITRATE 0.05 MG/ML IJ SOLN
INTRAMUSCULAR | Status: AC
  Administered 2013-11-27: 50 ug via INTRAVENOUS
  Filled 2013-11-27: qty 2

## 2013-11-27 MED ORDER — ONDANSETRON HCL 4 MG/2ML IJ SOLN: 4.0000 mg | Freq: Four times a day (QID) | INTRAMUSCULAR | Status: DC | PRN

## 2013-11-27 MED ORDER — SODIUM CHLORIDE 0.9 % IV SOLN: INTRAVENOUS | Status: DC

## 2013-11-27 MED ORDER — SCOPOLAMINE 1 MG/3DAYS TD PT72
MEDICATED_PATCH | TRANSDERMAL | Status: AC
  Filled 2013-11-27: qty 1

## 2013-11-27 MED ORDER — PROPOFOL 10 MG/ML IV BOLUS
INTRAVENOUS | Status: DC | PRN
  Administered 2013-11-27: 170 mg via INTRAVENOUS

## 2013-11-27 MED ORDER — LIDOCAINE HCL (CARDIAC) 20 MG/ML IV SOLN
INTRAVENOUS | Status: AC
  Filled 2013-11-27: qty 5

## 2013-11-27 MED ORDER — ROCURONIUM BROMIDE 50 MG/5ML IV SOLN
INTRAVENOUS | Status: AC
  Filled 2013-11-27: qty 1

## 2013-11-27 MED ORDER — LACTATED RINGERS IV SOLN
INTRAVENOUS | Status: DC
  Administered 2013-11-27 (×3): via INTRAVENOUS

## 2013-11-27 MED ORDER — DOCUSATE SODIUM 100 MG PO CAPS
100.0000 mg | ORAL_CAPSULE | Freq: Two times a day (BID) | ORAL | Status: DC
  Administered 2013-11-27 – 2013-11-28 (×2): 100 mg via ORAL
  Filled 2013-11-27 (×2): qty 1

## 2013-11-27 MED ORDER — FENTANYL CITRATE 0.05 MG/ML IJ SOLN
INTRAMUSCULAR | Status: AC
  Filled 2013-11-27: qty 5

## 2013-11-27 MED ORDER — FENTANYL CITRATE 0.05 MG/ML IJ SOLN
INTRAMUSCULAR | Status: DC | PRN
  Administered 2013-11-27 (×5): 50 ug via INTRAVENOUS

## 2013-11-27 MED ORDER — SCOPOLAMINE 1 MG/3DAYS TD PT72
1.0000 | MEDICATED_PATCH | TRANSDERMAL | Status: DC
  Administered 2013-11-27: 1.5 mg via TRANSDERMAL

## 2013-11-27 MED ORDER — SCOPOLAMINE 1 MG/3DAYS TD PT72: 1.0000 | MEDICATED_PATCH | TRANSDERMAL | Status: AC

## 2013-11-27 MED ORDER — ONDANSETRON HCL 4 MG PO TABS: 4.0000 mg | ORAL_TABLET | Freq: Four times a day (QID) | ORAL | Status: DC | PRN

## 2013-11-27 MED ORDER — MIDAZOLAM HCL 5 MG/ML IJ SOLN
1.0000 mg | Freq: Once | INTRAMUSCULAR | Status: DC
Start: 2013-11-27 — End: 2013-11-27

## 2013-11-27 MED ORDER — ONDANSETRON HCL 4 MG/2ML IJ SOLN
4.0000 mg | Freq: Once | INTRAMUSCULAR | Status: AC | PRN
  Administered 2013-11-27: 4 mg via INTRAVENOUS

## 2013-11-27 MED ORDER — MIDAZOLAM HCL 2 MG/2ML IJ SOLN
INTRAMUSCULAR | Status: AC
  Administered 2013-11-27: 1 mg
  Filled 2013-11-27: qty 2

## 2013-11-27 MED ORDER — DEXAMETHASONE SODIUM PHOSPHATE 4 MG/ML IJ SOLN
INTRAMUSCULAR | Status: AC
  Filled 2013-11-27: qty 2

## 2013-11-27 MED ORDER — METOCLOPRAMIDE HCL 5 MG/ML IJ SOLN: 5.0000 mg | Freq: Three times a day (TID) | INTRAMUSCULAR | Status: DC | PRN

## 2013-11-27 MED ORDER — ASPIRIN 325 MG PO TABS
325.0000 mg | ORAL_TABLET | Freq: Every day | ORAL | Status: DC
  Administered 2013-11-28: 325 mg via ORAL
  Filled 2013-11-27: qty 1

## 2013-11-27 MED ORDER — 0.9 % SODIUM CHLORIDE (POUR BTL) OPTIME
TOPICAL | Status: DC | PRN
  Administered 2013-11-27: 1000 mL

## 2013-11-27 MED ORDER — MIDAZOLAM HCL 2 MG/2ML IJ SOLN
INTRAMUSCULAR | Status: DC | PRN
  Administered 2013-11-27: 1 mg via INTRAVENOUS

## 2013-11-27 MED ORDER — SENNA 8.6 MG PO TABS
1.0000 | ORAL_TABLET | Freq: Two times a day (BID) | ORAL | Status: DC
  Administered 2013-11-27 – 2013-11-28 (×2): 8.6 mg via ORAL
  Filled 2013-11-27 (×3): qty 1

## 2013-11-27 MED ORDER — ONDANSETRON HCL 4 MG/2ML IJ SOLN
INTRAMUSCULAR | Status: DC | PRN
  Administered 2013-11-27: 4 mg via INTRAVENOUS

## 2013-11-27 MED ORDER — DEXAMETHASONE SODIUM PHOSPHATE 4 MG/ML IJ SOLN
INTRAMUSCULAR | Status: DC | PRN
  Administered 2013-11-27: 8 mg via INTRAVENOUS

## 2013-11-27 MED ORDER — GABAPENTIN 300 MG PO CAPS
300.0000 mg | ORAL_CAPSULE | Freq: Two times a day (BID) | ORAL | Status: DC
  Administered 2013-11-27 – 2013-11-28 (×2): 300 mg via ORAL
  Filled 2013-11-27 (×3): qty 1

## 2013-11-27 MED ORDER — LIDOCAINE HCL (CARDIAC) 20 MG/ML IV SOLN
INTRAVENOUS | Status: DC | PRN
  Administered 2013-11-27: 50 mg via INTRAVENOUS

## 2013-11-27 MED ORDER — MIDAZOLAM HCL 2 MG/2ML IJ SOLN: 1.0000 mg | Freq: Once | INTRAMUSCULAR | Status: DC

## 2013-11-27 MED ORDER — HYDROMORPHONE HCL PF 1 MG/ML IJ SOLN: 0.5000 mg | INTRAMUSCULAR | Status: DC | PRN

## 2013-11-27 MED ORDER — LABETALOL HCL 5 MG/ML IV SOLN
INTRAVENOUS | Status: DC | PRN
  Administered 2013-11-27: 5 mg via INTRAVENOUS

## 2013-11-27 MED ORDER — MIDAZOLAM HCL 2 MG/2ML IJ SOLN
INTRAMUSCULAR | Status: AC
  Filled 2013-11-27: qty 2

## 2013-11-27 MED ORDER — PROPOFOL 10 MG/ML IV BOLUS
INTRAVENOUS | Status: AC
  Filled 2013-11-27: qty 20

## 2013-11-27 MED ORDER — FENTANYL CITRATE 0.05 MG/ML IJ SOLN: 25.0000 ug | INTRAMUSCULAR | Status: DC | PRN

## 2013-11-27 MED ORDER — FENTANYL CITRATE 0.05 MG/ML IJ SOLN
50.0000 ug | Freq: Once | INTRAMUSCULAR | Status: AC
  Administered 2013-11-27: 50 ug via INTRAVENOUS

## 2013-11-27 SURGICAL SUPPLY — 63 items
BANDAGE ESMARK 6X9 LF (GAUZE/BANDAGES/DRESSINGS) ×1 IMPLANT
BLADE RECIP (BLADE) ×2 IMPLANT
BLADE RECIPRO TAPERED (BLADE) ×2 IMPLANT
BLADE SURG 15 STRL LF DISP TIS (BLADE) ×2 IMPLANT
BLADE SURG 15 STRL SS (BLADE) ×2
BNDG ESMARK 6X9 LF (GAUZE/BANDAGES/DRESSINGS) ×2
CANISTER SUCT 3000ML (MISCELLANEOUS) ×2 IMPLANT
CHLORAPREP W/TINT 26ML (MISCELLANEOUS) ×2 IMPLANT
CORE SLIDING POLY 10MM ANKLE (Orthopedic Implant) ×2 IMPLANT
COVER SURGICAL LIGHT HANDLE (MISCELLANEOUS) ×2 IMPLANT
CUFF TOURNIQUET SINGLE 34IN LL (TOURNIQUET CUFF) ×2 IMPLANT
CUFF TOURNIQUET SINGLE 44IN (TOURNIQUET CUFF) IMPLANT
DISPOSABLES PACK SBI (PACKS) ×2 IMPLANT
DRAPE C-ARM 42X72 X-RAY (DRAPES) ×2 IMPLANT
DRAPE C-ARMOR (DRAPES) ×2 IMPLANT
DRAPE EXTREMITY T 121X128X90 (DRAPE) ×2 IMPLANT
DRAPE ORTHO SPLIT 77X108 STRL (DRAPES) ×1
DRAPE SURG ORHT 6 SPLT 77X108 (DRAPES) ×1 IMPLANT
DRAPE U-SHAPE 47X51 STRL (DRAPES) ×2 IMPLANT
DRSG ADAPTIC 3X8 NADH LF (GAUZE/BANDAGES/DRESSINGS) IMPLANT
DRSG MEPILEX BORDER 4X4 (GAUZE/BANDAGES/DRESSINGS) ×2 IMPLANT
DRSG MEPITEL 4X7.2 (GAUZE/BANDAGES/DRESSINGS) ×2 IMPLANT
DRSG PAD ABDOMINAL 8X10 ST (GAUZE/BANDAGES/DRESSINGS) ×2 IMPLANT
ELECT REM PT RETURN 9FT ADLT (ELECTROSURGICAL) ×2
ELECTRODE REM PT RTRN 9FT ADLT (ELECTROSURGICAL) ×1 IMPLANT
EVACUATOR 1/8 PVC DRAIN (DRAIN) IMPLANT
GLOVE BIO SURGEON STRL SZ7 (GLOVE) ×2 IMPLANT
GLOVE BIO SURGEON STRL SZ8 (GLOVE) ×2 IMPLANT
GLOVE BIOGEL PI IND STRL 7.5 (GLOVE) ×1 IMPLANT
GLOVE BIOGEL PI IND STRL 8 (GLOVE) ×1 IMPLANT
GLOVE BIOGEL PI INDICATOR 7.5 (GLOVE) ×1
GLOVE BIOGEL PI INDICATOR 8 (GLOVE) ×1
GLOVE ORTHO TXT STRL SZ7.5 (GLOVE) ×2 IMPLANT
GOWN STRL REUS W/ TWL LRG LVL3 (GOWN DISPOSABLE) ×2 IMPLANT
GOWN STRL REUS W/ TWL XL LVL3 (GOWN DISPOSABLE) ×1 IMPLANT
GOWN STRL REUS W/TWL LRG LVL3 (GOWN DISPOSABLE) ×2
GOWN STRL REUS W/TWL XL LVL3 (GOWN DISPOSABLE) ×1
IMPLANT TALAR STAR SZ XS LF (Orthopedic Implant) ×2 IMPLANT
IMPLANT TIBIAL STAR SZ M (Orthopedic Implant) ×2 IMPLANT
KIT BASIN OR (CUSTOM PROCEDURE TRAY) ×2 IMPLANT
KIT ROOM TURNOVER OR (KITS) ×2 IMPLANT
NS IRRIG 1000ML POUR BTL (IV SOLUTION) ×2 IMPLANT
PACK TOTAL JOINT (CUSTOM PROCEDURE TRAY) ×2 IMPLANT
PAD ARMBOARD 7.5X6 YLW CONV (MISCELLANEOUS) ×4 IMPLANT
PAD CAST 4YDX4 CTTN HI CHSV (CAST SUPPLIES) ×1 IMPLANT
PADDING CAST ABS 4INX4YD NS (CAST SUPPLIES) ×1
PADDING CAST ABS 6INX4YD NS (CAST SUPPLIES) ×1
PADDING CAST ABS COTTON 4X4 ST (CAST SUPPLIES) ×1 IMPLANT
PADDING CAST ABS COTTON 6X4 NS (CAST SUPPLIES) ×1 IMPLANT
PADDING CAST COTTON 4X4 STRL (CAST SUPPLIES) ×1
PADDING CAST COTTON 6X4 STRL (CAST SUPPLIES) ×2 IMPLANT
SPLINT FIBERGLASS 4X15 (CAST SUPPLIES) ×2 IMPLANT
SPONGE GAUZE 4X4 12PLY (GAUZE/BANDAGES/DRESSINGS) ×2 IMPLANT
SUCTION FRAZIER TIP 10 FR DISP (SUCTIONS) ×2 IMPLANT
SUT ETHILON 3 0 PS 1 (SUTURE) ×2 IMPLANT
SUT MNCRL AB 3-0 PS2 18 (SUTURE) ×8 IMPLANT
SUT PROLENE 3 0 PS 2 (SUTURE) ×2 IMPLANT
SUT VIC AB 0 CT1 27 (SUTURE) ×2
SUT VIC AB 0 CT1 27XBRD ANBCTR (SUTURE) ×2 IMPLANT
SYNVASIVE BLADE SAGITTAL ×2 IMPLANT
TOWEL OR 17X24 6PK STRL BLUE (TOWEL DISPOSABLE) ×2 IMPLANT
TOWEL OR 17X26 10 PK STRL BLUE (TOWEL DISPOSABLE) ×2 IMPLANT
WATER STERILE IRR 1000ML POUR (IV SOLUTION) ×2 IMPLANT

## 2013-11-27 NOTE — Discharge Instructions (Signed)
Paula ArthursJohn Hewitt, MD [facility]  Please read the following information regarding your care after surgery.  Medications  You only need a prescription for the narcotic pain medicine (ex. oxycodone, Percocet, Norco).  All of the other medicines listed below are available over the counter. X acetominophen (Tylenol) 650 mg every 4-6 hours as you need for minor pain X Dilaudid as prescribed for moderate to severe pain   Narcotic pain medicine (ex. oxycodone, Percocet, Vicodin) will cause constipation.  To prevent this problem, take the following medicines while you are taking any pain medicine. X docusate sodium (Colace) 100 mg twice a day X senna (Senokot) 2 tablets twice a day  X To help prevent blood clots, take an aspirin (325 mg) once a day for a month after surgery.  You should also get up every hour while you are awake to move around.    Weight Bearing X Do not bear any weight on the operated leg or foot.  Cast / Splint / Dressing X Keep your splint or cast clean and dry.  Dont put anything (coat hanger, pencil, etc) down inside of it.  If it gets damp, use a hair dryer on the cool setting to dry it.  If it gets soaked, call the office to schedule an appointment for a cast change.    After your dressing, cast or splint is removed; you may shower, but do not soak or scrub the wound.  Allow the water to run over it, and then gently pat it dry.  Swelling It is normal for you to have swelling where you had surgery.  To reduce swelling and pain, keep your toes above your nose for at least 3 days after surgery.  It may be necessary to keep your foot or leg elevated for several weeks.  If it hurts, it should be elevated.  Follow Up Call my office at [phone] when you are discharged from the hospital or surgery center to schedule an appointment to be seen two weeks after surgery.  Call my office at [phone] if you develop a fever >101.5 F, nausea, vomiting, bleeding from the  surgical site or severe pain.

## 2013-11-27 NOTE — Transfer of Care (Signed)
Immediate Anesthesia Transfer of Care Note  Patient: Paula Potts  Procedure(s) Performed: Procedure(s): LEFT TOTAL ANKLE REPLACEMENT with gastrocnemius recession (Left)  Patient Location: PACU  Anesthesia Type:General and GA combined with regional for post-op pain  Level of Consciousness: awake, alert , oriented and patient cooperative  Airway & Oxygen Therapy: Patient Spontanous Breathing  Post-op Assessment: Report given to PACU RN, Post -op Vital signs reviewed and stable and Patient moving all extremities  Post vital signs: Reviewed and stable  Complications: No apparent anesthesia complications

## 2013-11-27 NOTE — Brief Op Note (Signed)
11/27/2013  4:26 PM  PATIENT:  Paula Potts  58 y.o. female  PRE-OPERATIVE DIAGNOSIS:  LEFT ANKLE ARTHRITIS AND TIGHT HEEL CORD  POST-OPERATIVE DIAGNOSIS:  left ankle arthritis with tight heel cord   Procedure(s): 1.  Left gastrocnemius recession 2.  LEFT TOTAL ANKLE REPLACEMENT  SURGEON:  Toni ArthursJohn Daielle Melcher, MD  ASSISTANT: Lorin PicketScott Flowers, PA-C  ANESTHESIA:   General, regional  EBL:  minimal   TOURNIQUET:   Total Tourniquet Time Documented: Thigh (Left) - 106 minutes Total: Thigh (Left) - 106 minutes   COMPLICATIONS:  None apparent  DISPOSITION:  Extubated, awake and stable to recovery.  DICTATION ID:  [id number]

## 2013-11-27 NOTE — Anesthesia Preprocedure Evaluation (Signed)
Anesthesia Evaluation  Patient identified by MRN, date of birth, ID band Patient awake    Reviewed: Allergy & Precautions, H&P , NPO status , Patient's Chart, lab work & pertinent test results  Airway Mallampati: I TM Distance: >3 FB Neck ROM: Full    Dental  (+) Teeth Intact   Pulmonary  breath sounds clear to auscultation        Cardiovascular hypertension, Rhythm:Regular Rate:Normal     Neuro/Psych    GI/Hepatic   Endo/Other    Renal/GU      Musculoskeletal   Abdominal   Peds  Hematology   Anesthesia Other Findings   Reproductive/Obstetrics                           Anesthesia Physical Anesthesia Plan  ASA: II  Anesthesia Plan: General   Post-op Pain Management:    Induction:   Airway Management Planned: LMA  Additional Equipment:   Intra-op Plan:   Post-operative Plan:   Informed Consent: I have reviewed the patients History and Physical, chart, labs and discussed the procedure including the risks, benefits and alternatives for the proposed anesthesia with the patient or authorized representative who has indicated his/her understanding and acceptance.   Dental advisory given  Plan Discussed with: CRNA and Anesthesiologist  Anesthesia Plan Comments:         Anesthesia Quick Evaluation

## 2013-11-27 NOTE — Anesthesia Procedure Notes (Signed)
Anesthesia Regional Block:  Adductor canal block  Pre-Anesthetic Checklist: ,, timeout performed, Correct Patient, Correct Site, Correct Laterality, Correct Procedure, Correct Position, site marked, Risks and benefits discussed,  Surgical consent,  Pre-op evaluation,  At surgeon's request and post-op pain management  Laterality: Left  Prep: chloraprep       Needles:  Injection technique: Single-shot  Needle Type: Echogenic Stimulator Needle     Needle Length: 9cm 9 cm Needle Gauge: 22 and 22 G    Additional Needles:  Procedures: ultrasound guided (picture in chart) and nerve stimulator Adductor canal block Narrative:  Start time: 11/27/2013 12:40 PM End time: 11/27/2013 12:50 PM Injection made incrementally with aspirations every 5 mL.  Performed by: Personally   Additional Notes: 40 cc 0.5% marcaine with 1:200 Epi and 8 mg decadron injected easily.

## 2013-11-27 NOTE — Anesthesia Postprocedure Evaluation (Signed)
  Anesthesia Post-op Note  Patient: Paula Potts  Procedure(s) Performed: Procedure(s): LEFT TOTAL ANKLE REPLACEMENT with gastrocnemius recession (Left)  Patient Location: Nursing Unit  Anesthesia Type:General and GA combined with regional for post-op pain  Level of Consciousness: awake, alert  and oriented  Airway and Oxygen Therapy: Patient Spontanous Breathing  Post-op Pain: none  Post-op Assessment: Post-op Vital signs reviewed, Patient's Cardiovascular Status Stable, Respiratory Function Stable, Patent Airway and Pain level controlled  Post-op Vital Signs: stable  Complications: No apparent anesthesia complications

## 2013-11-28 MED ORDER — HYDROMORPHONE HCL 2 MG PO TABS
2.0000 mg | ORAL_TABLET | ORAL | Status: DC | PRN
  Administered 2013-11-28 (×2): 4 mg via ORAL
  Filled 2013-11-28 (×2): qty 2

## 2013-11-28 NOTE — Progress Notes (Signed)
Utilization review completed. Charleene Callegari, RN, BSN. 

## 2013-11-28 NOTE — Progress Notes (Signed)
Physical Therapy Note  Nursing spoke with PT stating she has had total ankle arthoplasty for the opposite ankle recently and will most likely decline help from PT services.  PT visited patient and she states that she has no questions or concerns and is planning on discharging very soon. She reports having all needed durable medical equipment from previous surgeries and will have her son at home to help care for her during recover. Pt declines full evaluation by physical therapy.  PT will not pick up patient for caseload and is signing-off. Thank you for this referral. Please re-order for any significant changes in functional mobility.  [address] [place], [place]PT [phone]

## 2013-11-28 NOTE — Progress Notes (Signed)
Paula Potts  MRN: [id number] DOB/Age: 60/04/1956 60 y.o. Physician: Jacquelyne BalintKevin Supple,MD Procedure: Procedure(s) (LRB): LEFT TOTAL ANKLE REPLACEMENT with gastrocnemius recession (Left)     Subjective: Effects of regional block still in effect so no complaints of pain.   Vital Signs Temp:  [97.4 F (36.3 C)-98.5 F (36.9 C)] 97.5 F (36.4 C) (04/03 0600) Pulse Rate:  [50-91] 56 (04/03 0600) Resp:  [10-20] 18 (04/03 0600) BP: (96-144)/(54-97) 96/58 mmHg (04/03 0600) SpO2:  [90 %-100 %] 97 % (04/03 0600) Weight:  [58.514 kg (129 lb)] 58.514 kg (129 lb) (04/02 1900)  Lab Results No results found for this basename: WBC, HGB, HCT, PLT,  in the last 72 hours BMET No results found for this basename: NA, K, CL, CO2, GLUCOSE, BUN, CREATININE, CALCIUM,  in the last 72 hours INR  Date Value Ref Range Status  08/18/2013 0.87  0.00 - 1.49 Final     Exam Cast to LLE in good condition. Toes pink with brisk cap refill, still sensation out from block        Plan Overall patient doing well and well experienced as this is her second ankle replacement Feels comfortable with DC home, Rx in chart Instructions in chart and cont NWB FU 2 weeks  Galen Russman for Dr.Kevin Supple 11/28/2013, 10:05 AM

## 2013-11-28 NOTE — Care Management Note (Signed)
CARE MANAGEMENT NOTE 11/28/2013  Patient:  Paula Potts,Paula Potts   Account Number:  [id number]  Date Initiated:  11/28/2013  Documentation initiated by:  Vance PeperBRADY,Blossom Crume  Subjective/Objective Assessment:   58 yr old female admitted with left ankle arthritis, s/p left ankle replacement withgastranemius recession.     Action/Plan:   Case Manager spoke with patient concerning, DME needs. Patient has RW, 3in1, tub bench and shower chair.Patient has had multiple surgeries in past. Has family support.   Anticipated DC Date:  11/28/2013   Anticipated DC Plan:  HOME/SELF CARE      DC Planning Services  CM consult      PAC Choice  NA   Choice offered to / List presented to:     DME arranged  NA        HH arranged  NA      Status of service:  Completed, signed off

## 2013-11-28 NOTE — Progress Notes (Signed)
OT Cancellation Note  Patient Details Name: Paula LeschMary K Potts MRN: [id number] DOB: 04/04/1956   Cancelled Treatment:    Reason Eval/Treat Not Completed: OT screened, no needs identified and pt has all necessary DME at home, will sign off  Galen ManilaSpencer, Kynslei Art Jeanette 11/28/2013, 10:47 AM

## 2013-11-28 NOTE — Op Note (Signed)
NAME:  Paula Potts, Paula Potts               ACCOUNT NO.:  [id number]  MEDICAL RECORD NO.:  [id number]  LOCATION:  5N01C                        FACILITY:  [facility]  PHYSICIAN:  Toni ArthursJohn Cornesha Radziewicz, MD        DATE OF BIRTH:  1955/10/09  DATE OF PROCEDURE:  11/27/2013 DATE OF DISCHARGE:                              OPERATIVE REPORT   PREOPERATIVE DIAGNOSES: 1. Left tight heel cord. 2. Left ankle arthritis.  POSTOPERATIVE DIAGNOSES: 1. Left tight heel cord. 2. Left ankle arthritis.  PROCEDURE: 1. Left gastrocnemius recession. 2. Left total ankle replacement.  SURGEON:  Toni ArthursJohn Jamari Moten, MD  ASSISTANT:  Lorin PicketScott Flowers, PA-C.  ANESTHESIA:  General, regional.  ESTIMATED BLOOD LOSS:  Minimal.  TOURNIQUET TIME:  1 hour and 46 minutes at 220 mmHg.  COMPLICATIONS:  None apparent.  DISPOSITION:  Extubated, awake, and stable to recovery.  INDICATIONS FOR PROCEDURE:  The patient is a 58 year old woman with past medical history significant for left ankle arthritis.  She is status post right total ankle replacement a year ago.  She presents now for left total ankle replacement, as well as gastrocnemius recession for tight heel cord.  She has failed nonoperative treatment to date including activity modification, bracing, oral anti-inflammatory, shoe wear modification, and injections.  She understands the risks and benefits, the alternative treatment options, and elects surgical treatment.  She specifically understands risks of bleeding, infection, nerve damage, blood clots, need for additional surgery, continued pain, amputation, and death.  PROCEDURE IN DETAIL:  After preoperative consent was obtained and the correct operative site was identified, the patient was brought to the operating room and placed supine on the operating table.  General anesthesia was induced.  Preoperative antibiotics were administered. Surgical time-out was taken.  Left lower extremity was prepped and draped in standard sterile  fashion with tourniquet around the thigh. The extremity was exsanguinated.  The tourniquet was inflated to 220 mmHg.  A longitudinal incision was made at the medial aspect of the leg.  Sharp dissection was carried down through the skin and subcutaneous tissue. The superficial fascia was incised.  The plantaris tendon was divided under direct vision.  The gastrocnemius tendon was then isolated and the sural nerve was protected.  The gastrocnemius tendon was divided from medial to lateral under direct vision.  The wound was irrigated. Subcutaneous tissue was closed with Monocryl, the skin was closed with nylon.  Attention was then turned to the anterior aspect of the ankle, where a longitudinal incision was made.  The extensor retinaculum was incised over the extensor hallucis longus tendon and released proximally and distally.  The neurovascular bundle was mobilized and retracted laterally and protected through the duration of the case.  The anterior joint capsule was incised and elevated medially and laterally.  A subperiosteal dissection was carried around the medial malleolus releasing the deep deltoid as a sleeve full-thickness tissue.  The anterior osteophytes were then resected.  There was minimal cartilage left within the ankle joint.  A 3.2-mm guide pin was inserted at the level of the tibial tubercle distal to the patient's previous total knee replacement.  The external alignment guide was positioned on the leg and aligned in the  AP and lateral planes of the tibia.  Rotation of the guide was set with a 0.25-inch osteotome in the medial gutter.  The guide was then pinned distally.  The angel wing was used to set the resection level 5 mm from the tibial plafond.  Distal cutting guide was then applied and the medial and lateral alignment was confirmed on AP radiograph.  Guide pins were inserted and the distal tibial cut was made with the oscillating saw.  The guide was removed and  the cut bone was removed from the distal tibia.  The spacer block was inserted and was noted to have the appropriate depth of the cut.  The talar cutting guide was then inserted and pinned into position.  The guide was pinned into place after AP and lateral fluoroscopic images confirmed appropriate positioning.  The superior talar cut was then made, and the guide was removed along with the external alignment guide. The cut bone was removed and the cut surface of the talus was sized for an extra small talar component.  The AP cutting guide was inserted and pinned into place.  The anterior and posterior cuts were made.  The guide was then removed and replaced with the medial and lateral guide which was also pinned into position.  The medial and lateral cuts were made.  All of the cut bone was removed.  A window trial was inserted and was noted to fit appropriately.  It was pinned into position, and the keel hole was drilled.  The guide was removed, and the keel hole was then punched.  The wound was irrigated copiously.  Size extra-small [employer] STAR talar component was then impacted into position.  The tibial cut was sized for a medium and the trial component was placed and pinned into position.  AP and lateral fluoroscopic images confirmed appropriate position and size of this component.  The 2 barrel holes were drilled and punched.  A final size medium tibial component was inserted and impacted into position.  A trial poly was inserted. Ligamentous balance was noted to be appropriate.  Wound was irrigated copiously.  The final 10-mm polyethylene spacer was inserted.  A bone graft was inserted into the barrel holes anteriorly.  The guide pins were all removed.  The anterior joint capsule was repaired with simple sutures of 0 Vicryl.  The extensor retinaculum was repaired with a running 0 Vicryl as well as figure-of-eight 0 Vicryl.  Subcutaneous tissues were closed with Monocryl, and the  skin was closed with running Prolene.  Sterile dressings were applied followed by a well-padded short- leg cast with the ankle in neutral position.  The tourniquet had been released prior to application of the cast at 1 hour and 48 minutes.  The patient was awakened by Anesthesia and transported to the recovery room in stable condition.  FOLLOWUP PLAN:  The patient will be nonweightbearing on the left lower extremity.  She will be admitted for pain control and physical therapy. She will likely be discharged tomorrow after therapy.  She will take aspirin for DVT prophylaxis.  Scott Flowers, PA-C was present and scrubbed for the duration of the case.  His assistance was essentially gaining and maintaining exposure, performing the operation, closing and dressing the wounds, and applying the cast.     Toni Arthurs, MD     JH/MEDQ  D:  11/27/2013  T:  11/28/2013  Job:  [id number]

## 2013-11-28 NOTE — Progress Notes (Signed)
D/C instructions and scripts given to the Pt. Pt verbalized understanding of home care. Pts friend at bedside to transport Pt home at this time

## 2013-12-01 ENCOUNTER — Encounter (HOSPITAL_COMMUNITY): Payer: Self-pay | Admitting: Orthopedic Surgery

## 2013-12-04 NOTE — Addendum Note (Signed)
Addendum created 12/04/13 1044 by Kipp Broodavid Karliah Kowalchuk, MD   Modules edited: Anesthesia Attestations

## 2013-12-11 NOTE — Discharge Summary (Signed)
Physician Discharge Summary  Patient ID: Jearld LeschMary K Potts MRN: [id number] DOB/AGE: 59/04/1956 58 y.o.  Admit date: 11/27/2013 Discharge date: 12/11/2013  Admission Diagnoses:  Left ankle arthritis  Discharge Diagnoses:  Active Problems:   Arthritis of ankle, left   Discharged Condition: stable  Hospital Course: Pt was taken to the OR on 11/27/13 and underwent total ankle replacement.  The remainder of her hospital course was unremarkable and she was dishcarged to home in stable condition the following day.  Consults: None  Significant Diagnostic Studies: none  Treatments: surgery: as above  Discharge Exam: Blood pressure 96/58, pulse 56, temperature 97.5 F (36.4 C), temperature source Oral, resp. rate 18, height 5\' 2"  (1.575 m), weight 58.514 kg (129 lb), SpO2 97.00%. wn wd woman in nad.  L ankle splinted.  Brisk cap refill at toes.  wiggles toes and feels LT normally.  Disposition: 01-Home or Self Care  Discharge Orders   Future Orders Complete By Expires   Call MD / Call 911  As directed    Constipation Prevention  As directed    Diet - low sodium heart healthy  As directed    Driving restrictions  As directed    Increase activity slowly as tolerated  As directed    Lifting restrictions  As directed        Medication List         CALCIUM PO  Take 1 tablet by mouth daily.     CO Q-10 PO  Take 1 tablet by mouth daily.     gabapentin 300 MG capsule  Commonly known as:  NEURONTIN  Take 300 mg by mouth 2 (two) times daily.     HYDROmorphone 2 MG tablet  Commonly known as:  DILAUDID  Take 1 tablet (2 mg total) by mouth every 4 (four) hours as needed for severe pain.     lisinopril 20 MG tablet  Commonly known as:  PRINIVIL,ZESTRIL  Take 20 mg by mouth every morning.     multivitamin with minerals tablet  Take 1 tablet by mouth daily.     scopolamine 1 MG/3DAYS  Commonly known as:  TRANSDERM-SCOP  Place 1 patch (1.5 mg total) onto the skin every 3 (three)  days.     VITAMIN D PO  Take 1 tablet by mouth daily.           Follow-up Information   Follow up with Prisila Dlouhy, Jonny RuizJOHN, MD. Schedule an appointment as soon as possible for a visit in 2 weeks. (For suture removal)    Specialty:  Orthopedic Surgery   Contact information:   [address] [place] [id number] [phone]       Signed: Toni ArthursJohn Anuhea Potts 12/11/2013, 7:19 AM

## 2014-03-11 ENCOUNTER — Encounter: Payer: Self-pay | Admitting: *Deleted

## 2014-03-27 ENCOUNTER — Ambulatory Visit (INDEPENDENT_AMBULATORY_CARE_PROVIDER_SITE_OTHER): Payer: BC Managed Care – PPO

## 2014-03-27 VITALS — BP 127/84 | HR 67 | Resp 16 | Ht 62.0 in | Wt 120.0 lb

## 2014-03-27 DIAGNOSIS — M205X1 Other deformities of toe(s) (acquired), right foot: Secondary | ICD-10-CM

## 2014-03-27 DIAGNOSIS — M205X9 Other deformities of toe(s) (acquired), unspecified foot: Secondary | ICD-10-CM

## 2014-03-27 NOTE — Patient Instructions (Signed)
Pre-Operative Instructions  Congratulations, you have decided to take an important step to improving your quality of life.  You can be assured that the doctors of [facility] will be with you every step of the way.  1. Plan to be at the surgery center/hospital at least 1 (one) hour prior to your scheduled time unless otherwise directed by the surgical center/hospital staff.  You must have a responsible adult accompany you, remain during the surgery and drive you home.  Make sure you have directions to the surgical center/hospital and know how to get there on time. 2. For hospital based surgery you will need to obtain a history and physical form from your family physician within 1 month prior to the date of surgery- we will give you a form for you primary physician.  3. We make every effort to accommodate the date you request for surgery.  There are however, times where surgery dates or times have to be moved.  We will contact you as soon as possible if a change in schedule is required.   4. No Aspirin/Ibuprofen for one week before surgery.  If you are on aspirin, any non-steroidal anti-inflammatory medications (Mobic, Aleve, Ibuprofen) you should stop taking it 7 days prior to your surgery.  You make take Tylenol  For pain prior to surgery.  5. Medications- If you are taking daily heart and blood pressure medications, seizure, reflux, allergy, asthma, anxiety, pain or diabetes medications, make sure the surgery center/hospital is aware before the day of surgery so they may notify you which medications to take or avoid the day of surgery. 6. No food or drink after midnight the night before surgery unless directed otherwise by surgical center/hospital staff. 7. No alcoholic beverages 24 hours prior to surgery.  No smoking 24 hours prior to or 24 hours after surgery. 8. Wear loose pants or shorts- loose enough to fit over bandages, boots, and casts. 9. No slip on shoes, sneakers are best. 10. Bring  your boot with you to the surgery center/hospital.  Also bring crutches or a walker if your physician has prescribed it for you.  If you do not have this equipment, it will be provided for you after surgery. 11. If you have not been contracted by the surgery center/hospital by the day before your surgery, call to confirm the date and time of your surgery. 12. Leave-time from work may vary depending on the type of surgery you have.  Appropriate arrangements should be made prior to surgery with your employer. 13. Prescriptions will be provided immediately following surgery by your doctor.  Have these filled as soon as possible after surgery and take the medication as directed. 14. Remove nail polish on the operative foot. 15. Wash the night before surgery.  The night before surgery wash the foot and leg well with the antibacterial soap provided and water paying special attention to beneath the toenails and in between the toes.  Rinse thoroughly with water and dry well with a towel.  Perform this wash unless told not to do so by your physician.  Enclosed: 1 Ice pack (please put in freezer the night before surgery)   1 Hibiclens skin cleaner   Pre-op Instructions  If you have any questions regarding the instructions, do not hesitate to call our office.  [place]: [address] [place], [place] [address] [phone]  [place]: [address] [place], [place] [address] [phone]  [place]: [address]  [place],  [address] [phone]  Dr. Addysin Porco   Tuchman DPM, Dr. Norman Regal DPM Dr. Adrain Butrick DPM, Dr. M. Todd Hyatt DPM, Dr. Kathryn Egerton DPM 

## 2014-03-27 NOTE — Progress Notes (Signed)
   Subjective:    Patient ID: Paula Potts, female    DOB: 03/31/1956, 58 y.o.   MRN: [id number]  HPI Comments: "My foot is starting to bother me more"  Patient c/o aching 1st MPJ right for several years. She has noticed recently that the area has gotten more bothersome. The area swells and gets red. Shoes are uncomfortable and walking a lot aggravates. Limited ROM. Tries to wear wider, accommodative shoes.  Foot Pain      Review of Systems  All other systems reviewed and are negative.      Objective:   Physical Exam 58-year-old white female well-developed well-nourished oriented x3 presents this time with continued pain and increasing pain of her bunion right foot more so than left. Also hammering of the second toe which is causing pain discomfort and keratoses. Lotion the objective findings as follows vascular status is intact pedal pulses are palpable DP postal for PT plus one over 4 bilateral capillary refill time 3 seconds mild varicosities noted. Patient is status post ankle joint replacements bilateral with well-healed incisions and scars and good function of the ankles noted. Neurologically epicritic and proprioceptive sensations intact and symmetric bilateral normal plantar response DTRs not listed dermatologic the skin color pigment normal hair growth absent nails unremarkable keratoses second right hammertoe deformity erythema first MTP area left x-rays reveal significant HAV deformity lateral deviation of hallux 20 IM angle of 14 sesamoid position 4-5. There is some asymmetric joint space narrowing and dorsal spurring osteophytes the first MTP area right foot. No signs of fracture no osseous abnormalities otherwise noted there is hammertoe deformity second digit right also confirmed on x-ray. No open wounds ulcerations no secondary infection is noted at this time.       Assessment & Plan:  Assessment hallux abductovalgus deformity right with associated hallux limitus and  Oster arthropathy first MTP joint. Patient also has hammertoe deformity second digit right foot i patient is interested in surgical intervention at this time to patient understands that her foot will likely degenerative and require joint replacement if not addressed at this time and she is ready for [facility] with chilectomy first MTP area right as well as hammertoe repair second digit right with K wire fixation. Brisk L. tumors are reviewed all questions asked medication are answered and surgery scheduled her convenience [facility] surgery scheduled at this time  Alvan Dameichard Kalya Troeger DPM

## 2014-04-27 ENCOUNTER — Telehealth: Payer: Self-pay | Admitting: *Deleted

## 2014-04-27 NOTE — Telephone Encounter (Signed)
I'd like to schedule my foot surgery as soon as possible.  Thank you so much, bye bye.

## 2014-04-28 NOTE — Telephone Encounter (Signed)
Trying to reach [facility] to schedule surgery with Dr. Ralene Cork.

## 2014-04-28 NOTE — Telephone Encounter (Signed)
I left her a message to call me back, trying to reschedule surgery.

## 2014-04-29 NOTE — Telephone Encounter (Signed)
I called and left her a message that we can get the surgery scheduled for 05/11/2014.  If this date is not good, call and let me know.  I am going to go ahead and schedule this date for you.

## 2014-04-29 NOTE — Telephone Encounter (Signed)
I got your message regarding 05/11/2014 surgery and that is fine with me.  I guess we can get together later with the particulars about what time to be there.  Thank you so much for your voicemail.

## 2014-04-29 NOTE — Telephone Encounter (Signed)
I'm trying to reach Paula Potts to schedule a surgery.  I'm doing a test shop today so I'm on my way to [facility].  I won't be able to talk right away.  Just call me so we can schedule something for the next Monday or so.

## 2014-05-11 DIAGNOSIS — M2010 Hallux valgus (acquired), unspecified foot: Secondary | ICD-10-CM

## 2014-05-11 DIAGNOSIS — M2040 Other hammer toe(s) (acquired), unspecified foot: Secondary | ICD-10-CM

## 2014-05-13 ENCOUNTER — Telehealth: Payer: Self-pay

## 2014-05-13 NOTE — Telephone Encounter (Signed)
Left message for patient to call with questions or concerns. 

## 2014-05-14 NOTE — Progress Notes (Signed)
Dr Blenda Mounts performed a right Liane Comber and right 2nd met hammertoe repair

## 2014-05-19 ENCOUNTER — Ambulatory Visit (INDEPENDENT_AMBULATORY_CARE_PROVIDER_SITE_OTHER): Payer: BC Managed Care – PPO

## 2014-05-19 VITALS — BP 138/100 | HR 69 | Resp 12

## 2014-05-19 DIAGNOSIS — M205X9 Other deformities of toe(s) (acquired), unspecified foot: Secondary | ICD-10-CM

## 2014-05-19 DIAGNOSIS — M2041 Other hammer toe(s) (acquired), right foot: Secondary | ICD-10-CM

## 2014-05-19 DIAGNOSIS — M2040 Other hammer toe(s) (acquired), unspecified foot: Secondary | ICD-10-CM

## 2014-05-19 DIAGNOSIS — Z09 Encounter for follow-up examination after completed treatment for conditions other than malignant neoplasm: Secondary | ICD-10-CM

## 2014-05-19 DIAGNOSIS — M205X1 Other deformities of toe(s) (acquired), right foot: Secondary | ICD-10-CM

## 2014-05-19 NOTE — Patient Instructions (Signed)

## 2014-05-19 NOTE — Progress Notes (Signed)
   Subjective:    Patient ID: Garvin Fila Gehrig, female    DOB: 1956/07/14, 58 y.o.   MRN: [id number]  HPI DOS 05-12-14 right Liane Comber and right 2nd met hammertoe repair  ''RT FOOT IS DOING OK   Review of Systems no new findings or changes noted     Objective:   Physical Exam Vascular status is intact pedal pulses palpable DP and PT +2/4 patient states she had no pain for the first 24-36 hours with the novocaine wore off she started having some soreness although not severe pain. Incision clean dry well coapted dressings intact and dry wounds are well coapted no dehiscence no discharge or drainage noted x-rays reveal good position the osteotomy intact K wire fixations from the hallux and second toe pathology report confirmed noticeable malignancy arthritic changes of bone and cartilage are noted at this time the incision well coapted       Assessment & Plan:  Assessment this time is good postop progress 8 days status post Liane Comber with cheilectomy first MTP area right as well as hammertoe repair second right presto compressive dressing was reapplied maintain air fracture boot recheck in one week plan for suture removal. Moderate walking activities recommended elevated ice when possible maintain air fracture boot at all times  Harriet Masson DPM

## 2014-05-26 ENCOUNTER — Ambulatory Visit (INDEPENDENT_AMBULATORY_CARE_PROVIDER_SITE_OTHER): Payer: BC Managed Care – PPO

## 2014-05-26 VITALS — BP 144/87 | HR 66 | Resp 16

## 2014-05-26 DIAGNOSIS — M2040 Other hammer toe(s) (acquired), unspecified foot: Secondary | ICD-10-CM

## 2014-05-26 DIAGNOSIS — M2041 Other hammer toe(s) (acquired), right foot: Secondary | ICD-10-CM

## 2014-05-26 DIAGNOSIS — Z09 Encounter for follow-up examination after completed treatment for conditions other than malignant neoplasm: Secondary | ICD-10-CM

## 2014-05-26 DIAGNOSIS — M205X9 Other deformities of toe(s) (acquired), unspecified foot: Secondary | ICD-10-CM

## 2014-05-26 DIAGNOSIS — M205X1 Other deformities of toe(s) (acquired), right foot: Secondary | ICD-10-CM

## 2014-05-26 NOTE — Progress Notes (Signed)
   Subjective:    Patient ID: Paula Potts, female    DOB: 05/31/1956, 58 y.o.   MRN: [id number]  HPI Comments: "Doing really good. I've become attached to the boot"  DOS 05-11-2014 POV [place] bunionectomy and HT 2nd toe right     Review of Systems no new findings or systemic changes     Objective:   Physical Exam Neurovascular status is intact pedal pulses palpable DP and PT posterior were for incision is well coapted dressings intact and dry. This time dressings are removed sutures removed second toe right foot patient given instructions for daily range of motion exercises of the great toe joint dispense compression stocking to maintain anklet during the day we've off at night when it air dry after washing every evening do 100 toe pushup exercises daily as instructed. Maintain air fracture boot for another 3 weeks as instructed. At this time K wires are stable will maintain a place for another 3 weeks. Sutures removed today Neosporin and Coflex wrap and applied to the second toe and buddy wrap the second and third toes to maintain compression and stability to the second toe and maintain pin fixation. Maintain air fracture boot as instructed recheck in 3 weeks plan for followup x-ray and pin removal      Assessment & Plan:  Assessment good postop progress following bunion and cut the first MTP area right and instructed active passive range of motion exercises of the great toe joint maintain compression stocking as instructed recheck in 3 weeks for followup  Alvan Dameichard Yutaka Holberg DPM

## 2014-05-26 NOTE — Patient Instructions (Signed)
ICE INSTRUCTIONS  Apply ice or cold pack to the affected area at least 3 times a day for 10-15 minutes each time.  You should also use ice after prolonged activity or vigorous exercise.  Do not apply ice longer than 20 minutes at one time.  Always keep a cloth between your skin and the ice pack to prevent burns.  Being consistent and following these instructions will help control your symptoms.  We suggest you purchase a gel ice pack because they are reusable and do bit leak.  Some of them are designed to wrap around the area.  Use the method that works best for you.  Here are some other suggestions for icing.   Use a frozen bag of peas or corn-inexpensive and molds well to your body, usually stays frozen for 10 to 20 minutes.  Wet a towel with cold water and squeeze out the excess until it's damp.  Place in a bag in the freezer for 20 minutes. Then remove and use.  May resume normal bathing and hygiene wash foot daily with soap and water dried thoroughly apply some Neosporin are cocoa butter to the incision and pin sites areas  Recommend toe exercises moving the great toe joint up and down multiple times throughout the day suggested 100 toe pushups a day divided in 3 or 4 sessions every morning lunch dinner and evening. Maintain compression stocking during the day wash and remove stocking every evening while sleeping allowed to dry overnight. The and reapply stocking every morning to maintain compression

## 2014-05-28 IMAGING — CR DG CHEST 2V
2 series · 2 of 2 positions shown · non-contrast
Comparison: 05/10/2009.

CLINICAL DATA: Preop for ankle surgery.

CHEST - 2 VIEW

[view not recorded (1 of 2)]
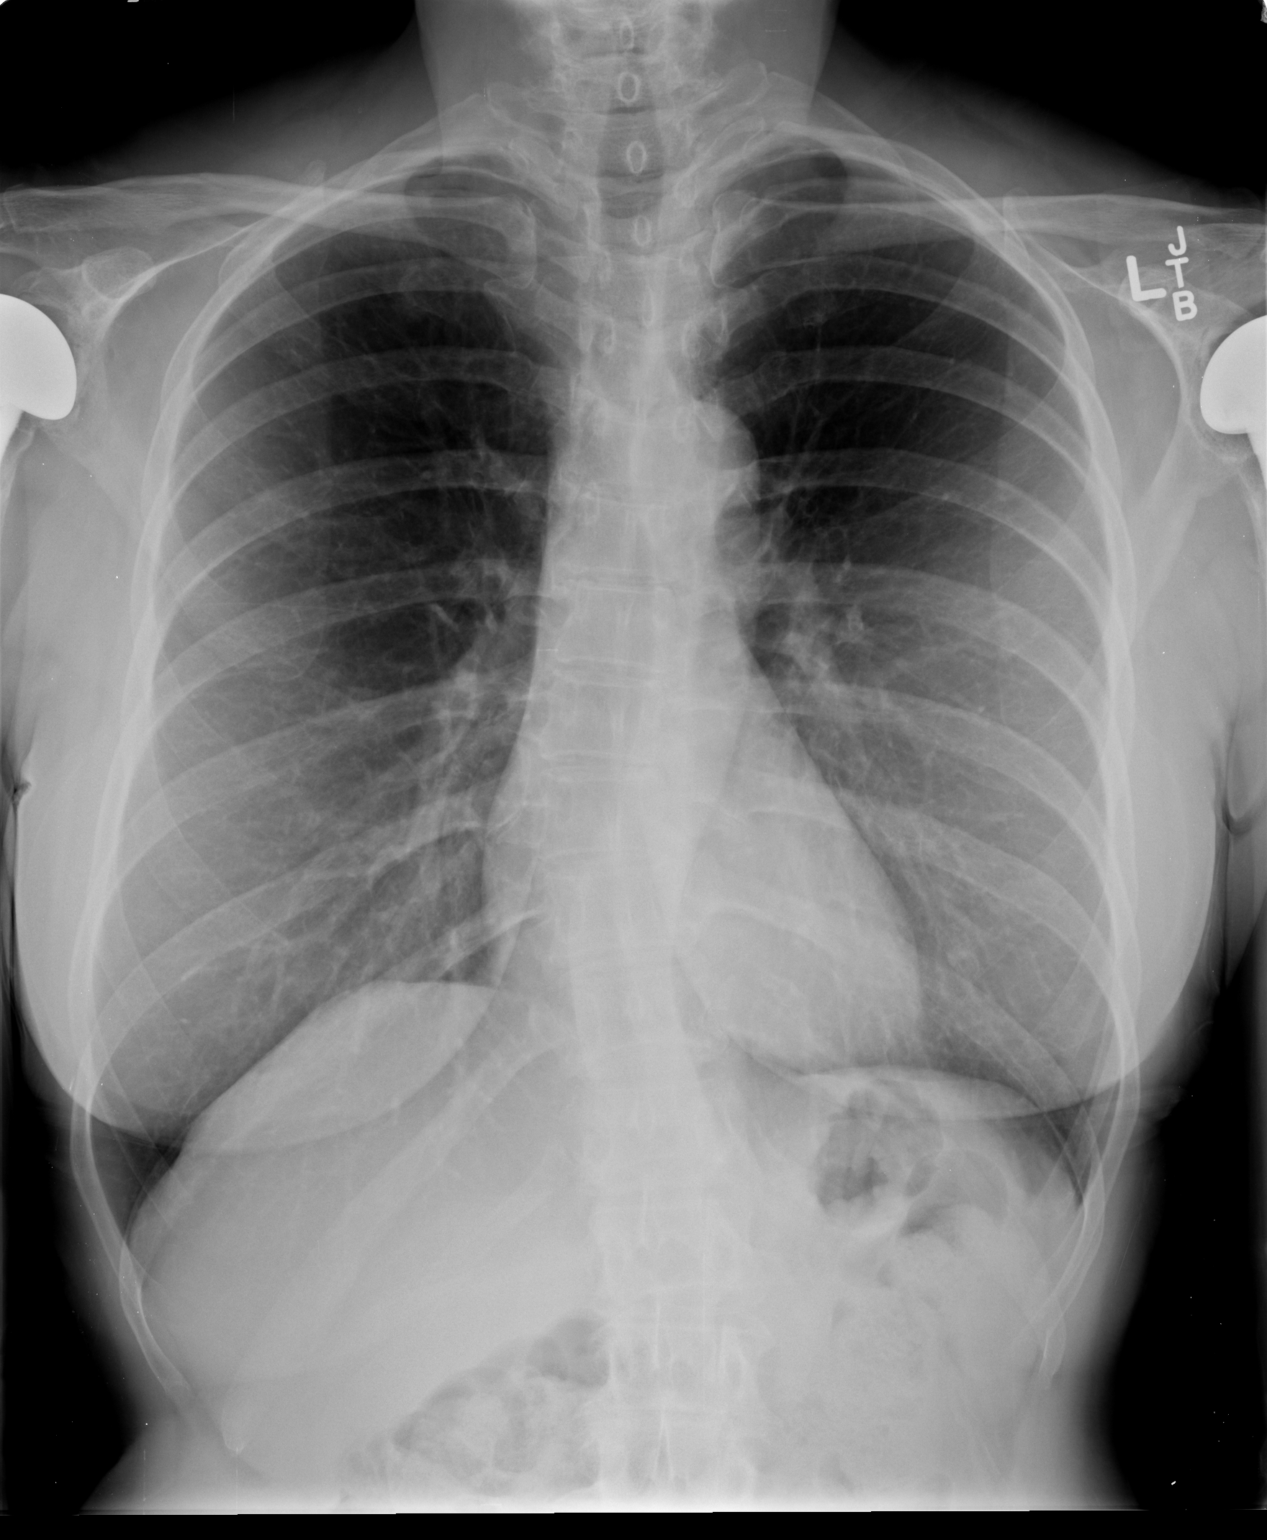

[view not recorded (2 of 2)]
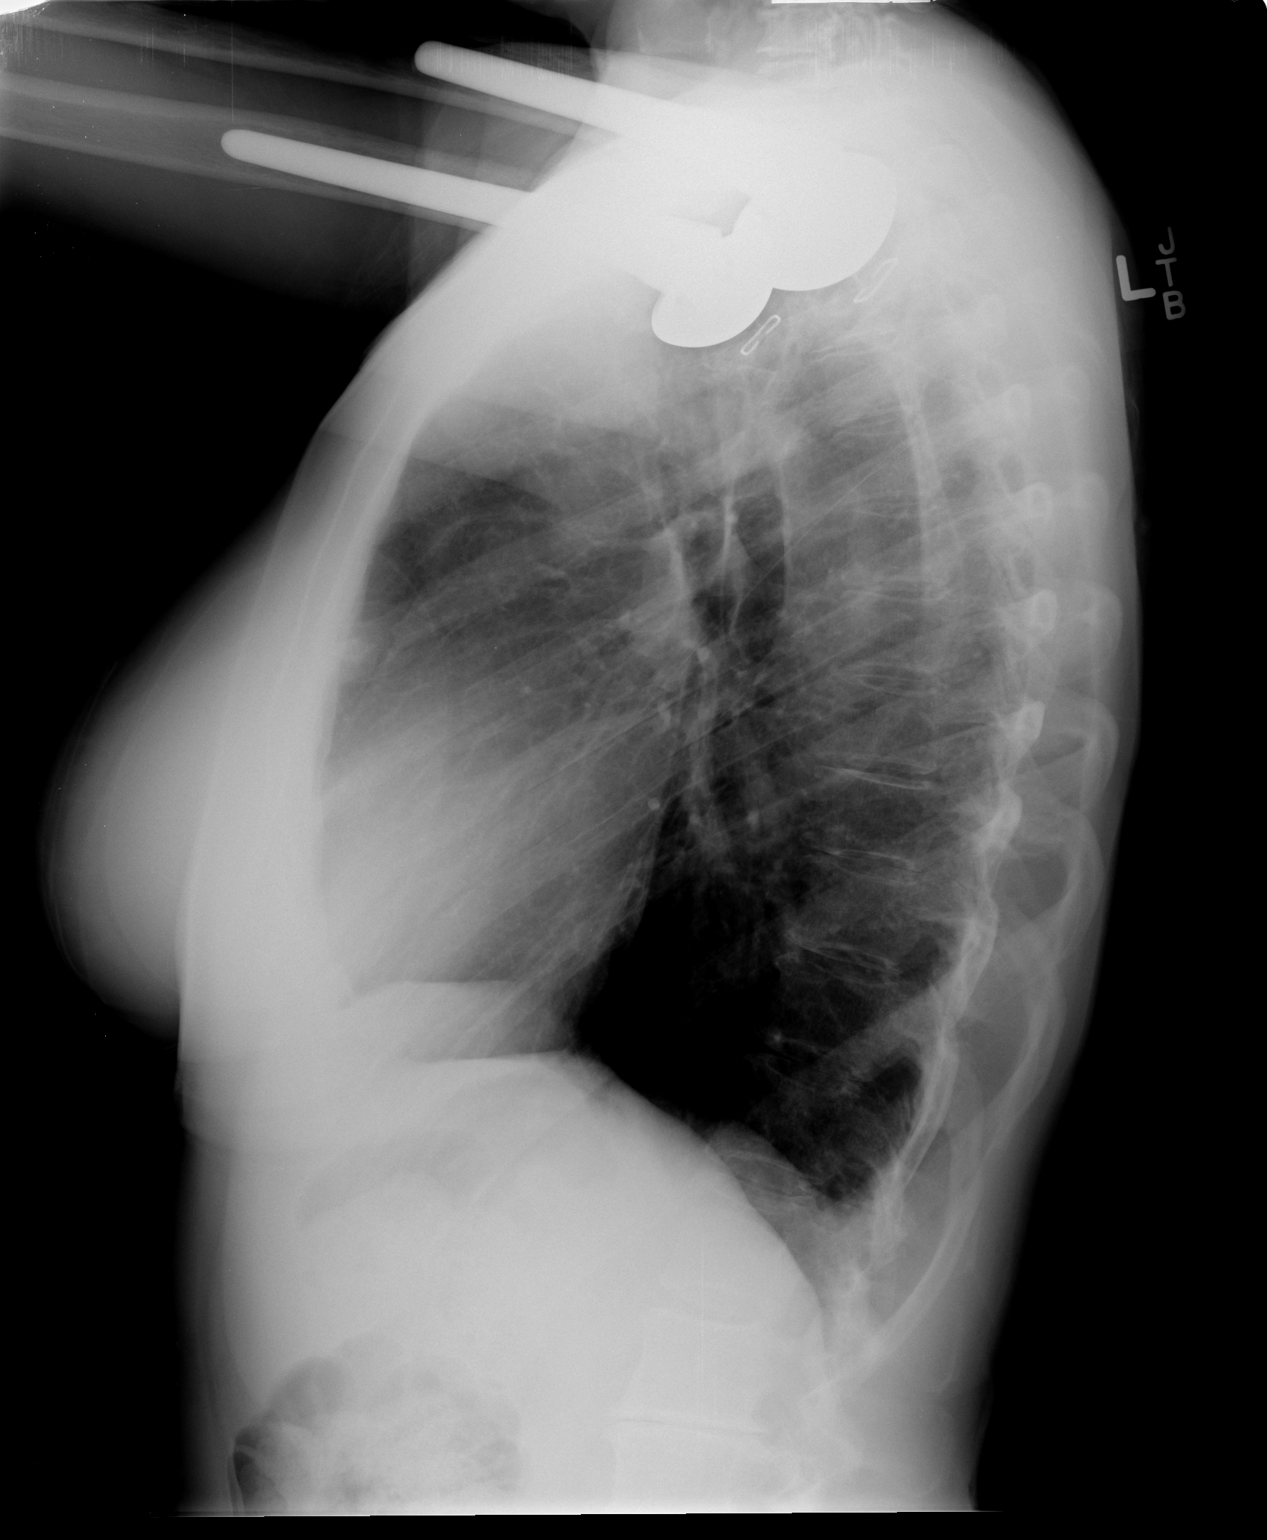

[2 of 2 positions shown; findings below may reference images not displayed]

FINDINGS: The heart, mediastinum and hilar contours are normal.
The aorta is tortuous.  The lungs are clear.  There are no
effusions or pneumothoraces.  There is a mild dextroscoliosis of
the mid thoracic spine.  There are bilateral humeral prosthesis.
IMPRESSION: No active disease.

## 2014-06-16 ENCOUNTER — Ambulatory Visit (INDEPENDENT_AMBULATORY_CARE_PROVIDER_SITE_OTHER): Payer: Self-pay

## 2014-06-16 DIAGNOSIS — Z09 Encounter for follow-up examination after completed treatment for conditions other than malignant neoplasm: Secondary | ICD-10-CM

## 2014-06-16 DIAGNOSIS — M21611 Bunion of right foot: Secondary | ICD-10-CM

## 2014-06-16 DIAGNOSIS — M2011 Hallux valgus (acquired), right foot: Secondary | ICD-10-CM

## 2014-06-16 DIAGNOSIS — M2041 Other hammer toe(s) (acquired), right foot: Secondary | ICD-10-CM

## 2014-06-16 DIAGNOSIS — M205X1 Other deformities of toe(s) (acquired), right foot: Secondary | ICD-10-CM

## 2014-06-16 NOTE — Patient Instructions (Signed)
ICE INSTRUCTIONS  Apply ice or cold pack to the affected area at least 3 times a day for 10-15 minutes each time.  You should also use ice after prolonged activity or vigorous exercise.  Do not apply ice longer than 20 minutes at one time.  Always keep a cloth between your skin and the ice pack to prevent burns.  Being consistent and following these instructions will help control your symptoms.  We suggest you purchase a gel ice pack because they are reusable and do bit leak.  Some of them are designed to wrap around the area.  Use the method that works best for you.  Here are some other suggestions for icing.   Use a frozen bag of peas or corn-inexpensive and molds well to your body, usually stays frozen for 10 to 20 minutes.  Wet a towel with cold water and squeeze out the excess until it's damp.  Place in a bag in the freezer for 20 minutes. Then remove and use.  Maintain Coflex buddy wrap in of toes 23 and 4 for at least another week or 2 to help stabilize the toe

## 2014-06-16 NOTE — Progress Notes (Signed)
   Subjective:    Patient ID: Paula LeschMary K Potts, female    DOB: 10/17/1955, 58 y.o.   MRN: [id number]  HPI Comments: DOS 05/11/2014 Right [place] bunionectomy, 2nd hammer toe repair with pin.  Pt states she did so well.     Review of Systems no new findings or systemic changes noted     Objective:   Physical Exam Neurovascular status is intact incisions clean dry well coapted pedal pulses are palpable bilateral there is well-healed incision K wire still present second digit right foot patient five-week status post [place] bunionectomy right and hammertoe repair second right x-rays of the position osteotomy first metatarsal alignment of the fixation second digit pinning his questions slightly hours ready for removal at this time. The second toe was prepped with Betadine and Neosporin the pin is removed Neosporin and Band-Aid Coflex wrap in the digits 23 and 4 carried out at this time. Maintain buddy wrap in as instructed for couple more weeks to may discontinue air fracture boot and resume good athletic walking or casual shoes no barefoot or flimsy shoes or foot       Assessment & Plan:  Assessment good postop progress proxy 1 month status post bunionectomy and hammertoe repair second right foot patient is doing well advised to continue with active passive range of motion exercises maintain stable shoe at all times recheck in 2 months for long-term followup and x-rays contact us the changed difficulties in the interim. Minimal or no pain no ecchymosis no dehiscence no signs of infection noted x-rays reveal good consolidation of the osteotomy. Next paragraph Alvan Dameichard Tabria Steines DPM

## 2014-08-04 ENCOUNTER — Ambulatory Visit (INDEPENDENT_AMBULATORY_CARE_PROVIDER_SITE_OTHER): Payer: BC Managed Care – PPO

## 2014-08-04 VITALS — BP 109/76 | HR 80 | Resp 12

## 2014-08-04 DIAGNOSIS — M2011 Hallux valgus (acquired), right foot: Secondary | ICD-10-CM

## 2014-08-04 DIAGNOSIS — M21611 Bunion of right foot: Secondary | ICD-10-CM

## 2014-08-04 DIAGNOSIS — M2041 Other hammer toe(s) (acquired), right foot: Secondary | ICD-10-CM

## 2014-08-04 DIAGNOSIS — Z09 Encounter for follow-up examination after completed treatment for conditions other than malignant neoplasm: Secondary | ICD-10-CM

## 2014-08-04 NOTE — Patient Instructions (Addendum)
ICE INSTRUCTIONS  Apply ice or cold pack to the affected area at least 3 times a day for 10-15 minutes each time.  You should also use ice after prolonged activity or vigorous exercise.  Do not apply ice longer than 20 minutes at one time.  Always keep a cloth between your skin and the ice pack to prevent burns.  Being consistent and following these instructions will help control your symptoms.  We suggest you purchase a gel ice pack because they are reusable and do bit leak.  Some of them are designed to wrap around the area.  Use the method that works best for you.  Here are some other suggestions for icing.   Use a frozen bag of peas or corn-inexpensive and molds well to your body, usually stays frozen for 10 to 20 minutes.  Wet a towel with cold water and squeeze out the excess until it's damp.  Place in a bag in the freezer for 20 minutes. Then remove and use.   Any residual swelling should dissipate over the next 3-6 months. Free do any activities without restrictions at this point.

## 2014-08-04 NOTE — Progress Notes (Signed)
   Subjective:    Patient ID: Paula LeschMary K Potts, female    DOB: 10/12/1955, 58 y.o.   MRN: [id number]  HPI  DOS 05/11/2014 Right [place] bunionectomy, 2nd hammer toe repair with pin. '''RT FOOT  IS DOING MUCH BETTER AND FINALLY CAN WEAR DIFFERENT TYPES OF SHOES.''  Review of Systems no new findings or systemic changes noted     Objective:   Physical Exam Neurovascular status is intact pedal pulses palpable incisions well coapted x-rays reveal good position of the hallux and second digit on right foot has good range of motion approximately 50 dorsiflexion 5-10 plantar flexion no crepitus or pain the second digit is noted good clinical alignment K wire intact and the first metatarsal nondisplaced. No open wounds no ulcers no secondary infections patient is been wearing a variety of different shoes and ambulate without restrictions or without complaints no pain or discomfort noted       Assessment & Plan:  Assessment good postop progress she is 3 months status post bunionectomy and hammertoe repair second right patient is discharged to an as-needed basis for any future follow-up swelling still present may last even up to 3-6 months post surgery suggested posse some ice maintain good stable shoes at all times. In the future discussed the possibility of surgery on contralateral foot however currently no pain or symptoms noted if the left foot becomes painful or symptomatic in the future we'll consider invasive options as needed possible bunion procedure on the left maybe and hammertoe repair pair A be needed at some point in the future next  [employer] [place]ard Karter Hellmer DPM

## 2014-10-21 DIAGNOSIS — M2011 Hallux valgus (acquired), right foot: Secondary | ICD-10-CM

## 2018-12-27 DIAGNOSIS — I10 Essential (primary) hypertension: Secondary | ICD-10-CM | POA: Diagnosis not present

## 2018-12-27 DIAGNOSIS — E039 Hypothyroidism, unspecified: Secondary | ICD-10-CM | POA: Diagnosis not present

## 2018-12-27 DIAGNOSIS — R1110 Vomiting, unspecified: Secondary | ICD-10-CM | POA: Diagnosis not present

## 2018-12-27 DIAGNOSIS — K30 Functional dyspepsia: Secondary | ICD-10-CM | POA: Diagnosis not present

## 2018-12-27 DIAGNOSIS — K219 Gastro-esophageal reflux disease without esophagitis: Secondary | ICD-10-CM | POA: Diagnosis not present

## 2019-01-06 DIAGNOSIS — K219 Gastro-esophageal reflux disease without esophagitis: Secondary | ICD-10-CM | POA: Diagnosis not present

## 2019-01-06 DIAGNOSIS — I10 Essential (primary) hypertension: Secondary | ICD-10-CM | POA: Diagnosis not present

## 2019-01-06 DIAGNOSIS — G4700 Insomnia, unspecified: Secondary | ICD-10-CM | POA: Diagnosis not present

## 2019-01-06 DIAGNOSIS — E039 Hypothyroidism, unspecified: Secondary | ICD-10-CM | POA: Diagnosis not present

## 2019-01-06 DIAGNOSIS — K30 Functional dyspepsia: Secondary | ICD-10-CM | POA: Diagnosis not present

## 2019-03-25 DIAGNOSIS — Z124 Encounter for screening for malignant neoplasm of cervix: Secondary | ICD-10-CM | POA: Diagnosis not present

## 2019-03-25 DIAGNOSIS — Z1231 Encounter for screening mammogram for malignant neoplasm of breast: Secondary | ICD-10-CM | POA: Diagnosis not present

## 2019-04-08 DIAGNOSIS — M1990 Unspecified osteoarthritis, unspecified site: Secondary | ICD-10-CM | POA: Diagnosis not present

## 2019-04-08 DIAGNOSIS — Z0000 Encounter for general adult medical examination without abnormal findings: Secondary | ICD-10-CM | POA: Diagnosis not present

## 2019-04-08 DIAGNOSIS — I10 Essential (primary) hypertension: Secondary | ICD-10-CM | POA: Diagnosis not present

## 2019-04-08 DIAGNOSIS — E039 Hypothyroidism, unspecified: Secondary | ICD-10-CM | POA: Diagnosis not present

## 2019-04-08 DIAGNOSIS — Z1389 Encounter for screening for other disorder: Secondary | ICD-10-CM | POA: Diagnosis not present

## 2019-04-08 DIAGNOSIS — E7800 Pure hypercholesterolemia, unspecified: Secondary | ICD-10-CM | POA: Diagnosis not present

## 2019-04-08 DIAGNOSIS — N951 Menopausal and female climacteric states: Secondary | ICD-10-CM | POA: Diagnosis not present

## 2019-04-08 DIAGNOSIS — Z23 Encounter for immunization: Secondary | ICD-10-CM | POA: Diagnosis not present

## 2019-04-08 DIAGNOSIS — F39 Unspecified mood [affective] disorder: Secondary | ICD-10-CM | POA: Diagnosis not present

## 2019-07-03 DIAGNOSIS — H16201 Unspecified keratoconjunctivitis, right eye: Secondary | ICD-10-CM | POA: Diagnosis not present

## 2019-07-03 DIAGNOSIS — H2000 Unspecified acute and subacute iridocyclitis: Secondary | ICD-10-CM | POA: Diagnosis not present

## 2019-07-08 DIAGNOSIS — E039 Hypothyroidism, unspecified: Secondary | ICD-10-CM | POA: Diagnosis not present

## 2019-10-06 DIAGNOSIS — H16143 Punctate keratitis, bilateral: Secondary | ICD-10-CM | POA: Diagnosis not present

## 2019-10-09 DIAGNOSIS — E039 Hypothyroidism, unspecified: Secondary | ICD-10-CM | POA: Diagnosis not present

## 2019-10-09 DIAGNOSIS — E7800 Pure hypercholesterolemia, unspecified: Secondary | ICD-10-CM | POA: Diagnosis not present

## 2019-10-09 DIAGNOSIS — I10 Essential (primary) hypertension: Secondary | ICD-10-CM | POA: Diagnosis not present

## 2019-10-14 DIAGNOSIS — H2513 Age-related nuclear cataract, bilateral: Secondary | ICD-10-CM | POA: Diagnosis not present

## 2020-01-08 DIAGNOSIS — H2513 Age-related nuclear cataract, bilateral: Secondary | ICD-10-CM | POA: Diagnosis not present

## 2020-01-08 DIAGNOSIS — H25013 Cortical age-related cataract, bilateral: Secondary | ICD-10-CM | POA: Diagnosis not present

## 2020-01-08 DIAGNOSIS — H18413 Arcus senilis, bilateral: Secondary | ICD-10-CM | POA: Diagnosis not present

## 2020-01-08 DIAGNOSIS — H2512 Age-related nuclear cataract, left eye: Secondary | ICD-10-CM | POA: Diagnosis not present

## 2020-01-08 DIAGNOSIS — H25043 Posterior subcapsular polar age-related cataract, bilateral: Secondary | ICD-10-CM | POA: Diagnosis not present

## 2020-02-23 DIAGNOSIS — H5212 Myopia, left eye: Secondary | ICD-10-CM | POA: Diagnosis not present

## 2020-02-23 DIAGNOSIS — H2512 Age-related nuclear cataract, left eye: Secondary | ICD-10-CM | POA: Diagnosis not present

## 2020-02-24 DIAGNOSIS — H2511 Age-related nuclear cataract, right eye: Secondary | ICD-10-CM | POA: Diagnosis not present

## 2020-03-15 DIAGNOSIS — H52201 Unspecified astigmatism, right eye: Secondary | ICD-10-CM | POA: Diagnosis not present

## 2020-03-15 DIAGNOSIS — H2511 Age-related nuclear cataract, right eye: Secondary | ICD-10-CM | POA: Diagnosis not present

## 2020-04-05 DIAGNOSIS — L987 Excessive and redundant skin and subcutaneous tissue: Secondary | ICD-10-CM | POA: Diagnosis not present

## 2020-05-28 DIAGNOSIS — M859 Disorder of bone density and structure, unspecified: Secondary | ICD-10-CM | POA: Diagnosis not present

## 2020-05-28 DIAGNOSIS — E7800 Pure hypercholesterolemia, unspecified: Secondary | ICD-10-CM | POA: Diagnosis not present

## 2020-05-28 DIAGNOSIS — Z0000 Encounter for general adult medical examination without abnormal findings: Secondary | ICD-10-CM | POA: Diagnosis not present

## 2020-05-28 DIAGNOSIS — K219 Gastro-esophageal reflux disease without esophagitis: Secondary | ICD-10-CM | POA: Diagnosis not present

## 2020-05-28 DIAGNOSIS — F39 Unspecified mood [affective] disorder: Secondary | ICD-10-CM | POA: Diagnosis not present

## 2020-05-28 DIAGNOSIS — N951 Menopausal and female climacteric states: Secondary | ICD-10-CM | POA: Diagnosis not present

## 2020-05-28 DIAGNOSIS — Z23 Encounter for immunization: Secondary | ICD-10-CM | POA: Diagnosis not present

## 2020-05-28 DIAGNOSIS — I10 Essential (primary) hypertension: Secondary | ICD-10-CM | POA: Diagnosis not present

## 2020-05-28 DIAGNOSIS — M1990 Unspecified osteoarthritis, unspecified site: Secondary | ICD-10-CM | POA: Diagnosis not present

## 2020-06-03 DIAGNOSIS — M810 Age-related osteoporosis without current pathological fracture: Secondary | ICD-10-CM | POA: Diagnosis not present

## 2020-06-21 DIAGNOSIS — Z1231 Encounter for screening mammogram for malignant neoplasm of breast: Secondary | ICD-10-CM | POA: Diagnosis not present

## 2020-06-21 DIAGNOSIS — Z01419 Encounter for gynecological examination (general) (routine) without abnormal findings: Secondary | ICD-10-CM | POA: Diagnosis not present

## 2020-09-09 DIAGNOSIS — Z20822 Contact with and (suspected) exposure to covid-19: Secondary | ICD-10-CM | POA: Diagnosis not present

## 2020-09-14 DIAGNOSIS — Z20822 Contact with and (suspected) exposure to covid-19: Secondary | ICD-10-CM | POA: Diagnosis not present

## 2020-09-21 DIAGNOSIS — Z20822 Contact with and (suspected) exposure to covid-19: Secondary | ICD-10-CM | POA: Diagnosis not present

## 2020-10-04 DIAGNOSIS — M810 Age-related osteoporosis without current pathological fracture: Secondary | ICD-10-CM | POA: Diagnosis not present

## 2020-11-04 DIAGNOSIS — M810 Age-related osteoporosis without current pathological fracture: Secondary | ICD-10-CM | POA: Diagnosis not present

## 2020-11-26 DIAGNOSIS — E039 Hypothyroidism, unspecified: Secondary | ICD-10-CM | POA: Diagnosis not present

## 2020-11-26 DIAGNOSIS — Z7189 Other specified counseling: Secondary | ICD-10-CM | POA: Diagnosis not present

## 2020-11-26 DIAGNOSIS — E7800 Pure hypercholesterolemia, unspecified: Secondary | ICD-10-CM | POA: Diagnosis not present

## 2020-11-26 DIAGNOSIS — I10 Essential (primary) hypertension: Secondary | ICD-10-CM | POA: Diagnosis not present

## 2020-12-30 DIAGNOSIS — H16223 Keratoconjunctivitis sicca, not specified as Sjogren's, bilateral: Secondary | ICD-10-CM | POA: Diagnosis not present

## 2021-01-12 DIAGNOSIS — Z96662 Presence of left artificial ankle joint: Secondary | ICD-10-CM | POA: Diagnosis not present

## 2021-01-12 DIAGNOSIS — M25571 Pain in right ankle and joints of right foot: Secondary | ICD-10-CM | POA: Diagnosis not present

## 2021-01-12 DIAGNOSIS — Z96661 Presence of right artificial ankle joint: Secondary | ICD-10-CM | POA: Diagnosis not present

## 2021-01-12 DIAGNOSIS — M25572 Pain in left ankle and joints of left foot: Secondary | ICD-10-CM | POA: Diagnosis not present

## 2021-04-07 DIAGNOSIS — Z96662 Presence of left artificial ankle joint: Secondary | ICD-10-CM | POA: Diagnosis not present

## 2021-04-07 DIAGNOSIS — M25571 Pain in right ankle and joints of right foot: Secondary | ICD-10-CM | POA: Diagnosis not present

## 2021-04-07 DIAGNOSIS — M19079 Primary osteoarthritis, unspecified ankle and foot: Secondary | ICD-10-CM | POA: Diagnosis not present

## 2021-04-07 DIAGNOSIS — Q666 Other congenital valgus deformities of feet: Secondary | ICD-10-CM | POA: Diagnosis not present

## 2021-04-07 DIAGNOSIS — Z96661 Presence of right artificial ankle joint: Secondary | ICD-10-CM | POA: Diagnosis not present

## 2021-04-07 DIAGNOSIS — M79671 Pain in right foot: Secondary | ICD-10-CM | POA: Diagnosis not present

## 2021-04-13 DIAGNOSIS — Z96662 Presence of left artificial ankle joint: Secondary | ICD-10-CM | POA: Diagnosis not present

## 2021-04-13 DIAGNOSIS — M19072 Primary osteoarthritis, left ankle and foot: Secondary | ICD-10-CM | POA: Diagnosis not present

## 2021-04-13 DIAGNOSIS — M19071 Primary osteoarthritis, right ankle and foot: Secondary | ICD-10-CM | POA: Diagnosis not present

## 2021-04-13 DIAGNOSIS — Z96661 Presence of right artificial ankle joint: Secondary | ICD-10-CM | POA: Diagnosis not present

## 2021-04-13 DIAGNOSIS — M19079 Primary osteoarthritis, unspecified ankle and foot: Secondary | ICD-10-CM | POA: Diagnosis not present

## 2021-05-13 DIAGNOSIS — M25571 Pain in right ankle and joints of right foot: Secondary | ICD-10-CM | POA: Diagnosis not present

## 2021-05-13 DIAGNOSIS — Z96661 Presence of right artificial ankle joint: Secondary | ICD-10-CM | POA: Diagnosis not present

## 2021-06-22 DIAGNOSIS — Z1231 Encounter for screening mammogram for malignant neoplasm of breast: Secondary | ICD-10-CM | POA: Diagnosis not present

## 2021-07-01 DIAGNOSIS — Z23 Encounter for immunization: Secondary | ICD-10-CM | POA: Diagnosis not present

## 2021-07-01 DIAGNOSIS — F39 Unspecified mood [affective] disorder: Secondary | ICD-10-CM | POA: Diagnosis not present

## 2021-07-01 DIAGNOSIS — I10 Essential (primary) hypertension: Secondary | ICD-10-CM | POA: Diagnosis not present

## 2021-07-01 DIAGNOSIS — Z1389 Encounter for screening for other disorder: Secondary | ICD-10-CM | POA: Diagnosis not present

## 2021-07-01 DIAGNOSIS — K30 Functional dyspepsia: Secondary | ICD-10-CM | POA: Diagnosis not present

## 2021-07-01 DIAGNOSIS — E7800 Pure hypercholesterolemia, unspecified: Secondary | ICD-10-CM | POA: Diagnosis not present

## 2021-07-01 DIAGNOSIS — E039 Hypothyroidism, unspecified: Secondary | ICD-10-CM | POA: Diagnosis not present

## 2021-07-01 DIAGNOSIS — M810 Age-related osteoporosis without current pathological fracture: Secondary | ICD-10-CM | POA: Diagnosis not present

## 2021-07-01 DIAGNOSIS — Z0000 Encounter for general adult medical examination without abnormal findings: Secondary | ICD-10-CM | POA: Diagnosis not present

## 2021-07-06 DIAGNOSIS — Z01411 Encounter for gynecological examination (general) (routine) with abnormal findings: Secondary | ICD-10-CM | POA: Diagnosis not present

## 2021-07-06 DIAGNOSIS — Z6824 Body mass index (BMI) 24.0-24.9, adult: Secondary | ICD-10-CM | POA: Diagnosis not present

## 2021-07-06 DIAGNOSIS — Z124 Encounter for screening for malignant neoplasm of cervix: Secondary | ICD-10-CM | POA: Diagnosis not present

## 2021-07-06 DIAGNOSIS — Z01419 Encounter for gynecological examination (general) (routine) without abnormal findings: Secondary | ICD-10-CM | POA: Diagnosis not present

## 2021-08-04 ENCOUNTER — Other Ambulatory Visit (HOSPITAL_COMMUNITY): Payer: Self-pay | Admitting: Orthopedic Surgery

## 2021-09-27 ENCOUNTER — Other Ambulatory Visit: Payer: Self-pay

## 2021-09-27 ENCOUNTER — Encounter (HOSPITAL_BASED_OUTPATIENT_CLINIC_OR_DEPARTMENT_OTHER): Payer: Self-pay | Admitting: Orthopedic Surgery

## 2021-09-29 ENCOUNTER — Encounter (HOSPITAL_BASED_OUTPATIENT_CLINIC_OR_DEPARTMENT_OTHER)
Admission: RE | Admit: 2021-09-29 | Discharge: 2021-09-29 | Disposition: A | Discharge status: Home / Self Care | Payer: Medicare PPO | Source: Ambulatory Visit | Attending: Orthopedic Surgery | Admitting: Orthopedic Surgery

## 2021-09-29 DIAGNOSIS — Z01812 Encounter for preprocedural laboratory examination: Secondary | ICD-10-CM | POA: Diagnosis not present

## 2021-09-29 NOTE — Progress Notes (Signed)

## 2021-10-04 DIAGNOSIS — M810 Age-related osteoporosis without current pathological fracture: Secondary | ICD-10-CM | POA: Diagnosis not present

## 2021-10-05 DIAGNOSIS — Z96661 Presence of right artificial ankle joint: Secondary | ICD-10-CM | POA: Diagnosis not present

## 2021-10-05 DIAGNOSIS — M25572 Pain in left ankle and joints of left foot: Secondary | ICD-10-CM | POA: Diagnosis not present

## 2021-10-05 DIAGNOSIS — Z96662 Presence of left artificial ankle joint: Secondary | ICD-10-CM | POA: Diagnosis not present

## 2021-10-06 ENCOUNTER — Other Ambulatory Visit: Payer: Self-pay | Admitting: Student

## 2021-10-06 ENCOUNTER — Ambulatory Visit (HOSPITAL_BASED_OUTPATIENT_CLINIC_OR_DEPARTMENT_OTHER): Admission: RE | Admit: 2021-10-06 | Payer: Medicare PPO | Source: Home / Self Care | Admitting: Orthopedic Surgery

## 2021-10-06 DIAGNOSIS — M25572 Pain in left ankle and joints of left foot: Secondary | ICD-10-CM

## 2021-10-06 HISTORY — DX: Hypothyroidism, unspecified: E03.9

## 2021-10-06 HISTORY — DX: Pure hypercholesterolemia, unspecified: E78.00

## 2021-10-06 SURGERY — ARTHROPLASTY, ANKLE, TOTAL
Anesthesia: General | Site: Ankle | Laterality: Right

## 2021-11-02 ENCOUNTER — Ambulatory Visit
Admission: RE | Admit: 2021-11-02 | Discharge: 2021-11-02 | Disposition: A | Discharge status: Home / Self Care | Payer: Medicare PPO | Source: Ambulatory Visit | Attending: Student | Admitting: Student

## 2021-11-02 DIAGNOSIS — M25572 Pain in left ankle and joints of left foot: Secondary | ICD-10-CM

## 2021-11-02 DIAGNOSIS — Z471 Aftercare following joint replacement surgery: Secondary | ICD-10-CM | POA: Diagnosis not present

## 2021-11-02 DIAGNOSIS — S99912A Unspecified injury of left ankle, initial encounter: Secondary | ICD-10-CM | POA: Diagnosis not present

## 2021-11-02 DIAGNOSIS — Z96662 Presence of left artificial ankle joint: Secondary | ICD-10-CM | POA: Diagnosis not present

## 2021-11-07 DIAGNOSIS — M810 Age-related osteoporosis without current pathological fracture: Secondary | ICD-10-CM | POA: Diagnosis not present

## 2021-12-29 DIAGNOSIS — E7800 Pure hypercholesterolemia, unspecified: Secondary | ICD-10-CM | POA: Diagnosis not present

## 2021-12-29 DIAGNOSIS — E039 Hypothyroidism, unspecified: Secondary | ICD-10-CM | POA: Diagnosis not present

## 2021-12-29 DIAGNOSIS — I10 Essential (primary) hypertension: Secondary | ICD-10-CM | POA: Diagnosis not present

## 2022-06-28 DIAGNOSIS — M810 Age-related osteoporosis without current pathological fracture: Secondary | ICD-10-CM | POA: Diagnosis not present

## 2022-06-28 DIAGNOSIS — Z1231 Encounter for screening mammogram for malignant neoplasm of breast: Secondary | ICD-10-CM | POA: Diagnosis not present

## 2022-07-10 DIAGNOSIS — Z6824 Body mass index (BMI) 24.0-24.9, adult: Secondary | ICD-10-CM | POA: Diagnosis not present

## 2022-07-10 DIAGNOSIS — Z01411 Encounter for gynecological examination (general) (routine) with abnormal findings: Secondary | ICD-10-CM | POA: Diagnosis not present

## 2022-07-10 DIAGNOSIS — Z01419 Encounter for gynecological examination (general) (routine) without abnormal findings: Secondary | ICD-10-CM | POA: Diagnosis not present

## 2022-07-10 DIAGNOSIS — Z124 Encounter for screening for malignant neoplasm of cervix: Secondary | ICD-10-CM | POA: Diagnosis not present

## 2022-07-14 DIAGNOSIS — I10 Essential (primary) hypertension: Secondary | ICD-10-CM | POA: Diagnosis not present

## 2022-07-14 DIAGNOSIS — E7800 Pure hypercholesterolemia, unspecified: Secondary | ICD-10-CM | POA: Diagnosis not present

## 2022-07-14 DIAGNOSIS — M810 Age-related osteoporosis without current pathological fracture: Secondary | ICD-10-CM | POA: Diagnosis not present

## 2022-07-14 DIAGNOSIS — E039 Hypothyroidism, unspecified: Secondary | ICD-10-CM | POA: Diagnosis not present

## 2022-07-14 DIAGNOSIS — F39 Unspecified mood [affective] disorder: Secondary | ICD-10-CM | POA: Diagnosis not present

## 2022-07-14 DIAGNOSIS — K219 Gastro-esophageal reflux disease without esophagitis: Secondary | ICD-10-CM | POA: Diagnosis not present

## 2022-07-14 DIAGNOSIS — Z0000 Encounter for general adult medical examination without abnormal findings: Secondary | ICD-10-CM | POA: Diagnosis not present

## 2022-07-14 DIAGNOSIS — M1990 Unspecified osteoarthritis, unspecified site: Secondary | ICD-10-CM | POA: Diagnosis not present

## 2022-07-14 DIAGNOSIS — Z23 Encounter for immunization: Secondary | ICD-10-CM | POA: Diagnosis not present

## 2022-12-11 DIAGNOSIS — E673 Hypervitaminosis D: Secondary | ICD-10-CM | POA: Diagnosis not present

## 2023-01-12 DIAGNOSIS — E7800 Pure hypercholesterolemia, unspecified: Secondary | ICD-10-CM | POA: Diagnosis not present

## 2023-01-12 DIAGNOSIS — E039 Hypothyroidism, unspecified: Secondary | ICD-10-CM | POA: Diagnosis not present

## 2023-01-12 DIAGNOSIS — I10 Essential (primary) hypertension: Secondary | ICD-10-CM | POA: Diagnosis not present

## 2023-01-12 DIAGNOSIS — F39 Unspecified mood [affective] disorder: Secondary | ICD-10-CM | POA: Diagnosis not present

## 2023-01-12 DIAGNOSIS — M810 Age-related osteoporosis without current pathological fracture: Secondary | ICD-10-CM | POA: Diagnosis not present

## 2023-02-06 DIAGNOSIS — H16223 Keratoconjunctivitis sicca, not specified as Sjogren's, bilateral: Secondary | ICD-10-CM | POA: Diagnosis not present

## 2023-05-18 IMAGING — CT CT ANKLE*L* W/O CM
3 series · 12 of 33 positions shown, 14 images · non-contrast
Comparison: None.

CLINICAL DATA: Status post bilateral ankle joint replacement.
Status post fall on 10/04/2021, injuring left ankle.

EXAM:
CT OF THE LEFT ANKLE WITHOUT CONTRAST
TECHNIQUE: Multidetector CT imaging of the left ankle was performed according
to the standard protocol. Multiplanar CT image reconstructions were
also generated.
RADIATION DOSE REDUCTION: This exam was performed according to the
departmental dose-optimization program which includes automated
exposure control, adjustment of the mA and/or kV according to
patient size and/or use of iterative reconstruction technique.

[Series 6: sfov lower extremity 2.00 br40 s3 soft · axial · 0.24mm/px · z∈[+393,+511]mm · 4 of 85 slices shown, 5 images (1 of 3)]
[im 13/85  soft-tissue]
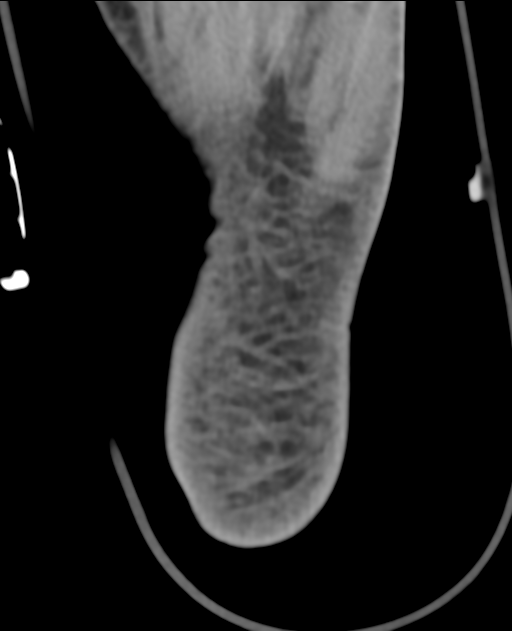
[im 13/85  bone]
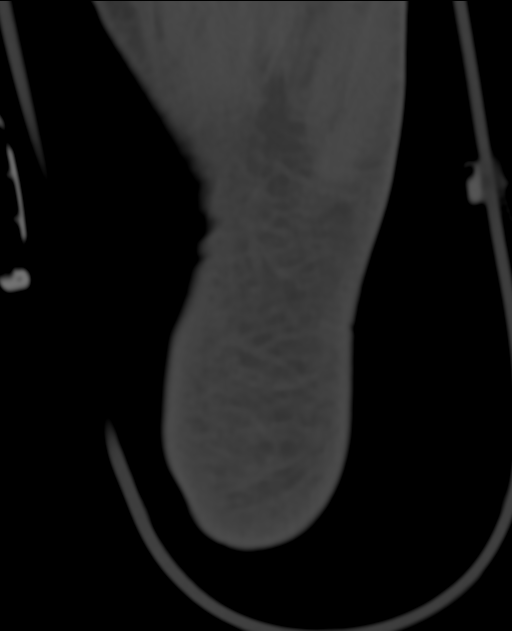
[im 33/85  bone]
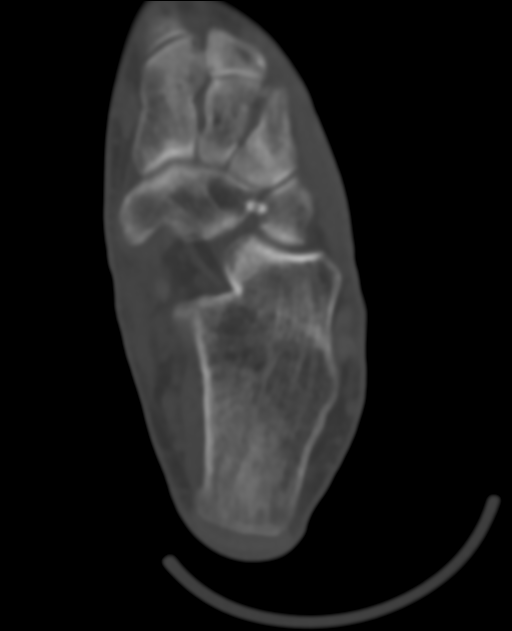
[im 52/85  bone]
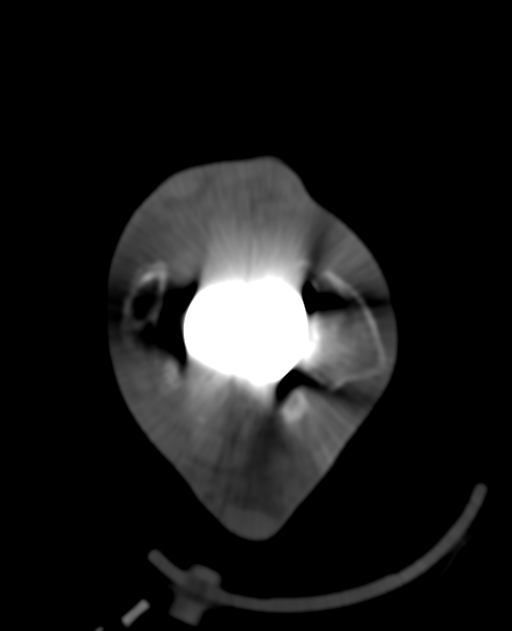
[im 72/85  bone]
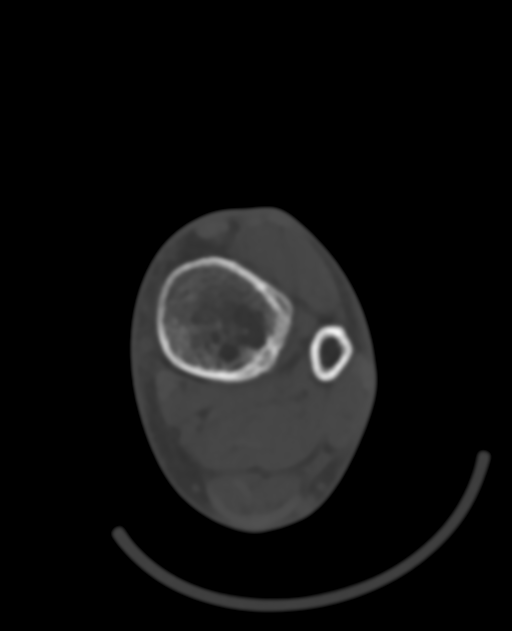

[Series 10: sfov lower extremity 2.00 br40 s3 soft · coronal · 0.18mm/px · 3 of 74 slices shown (2 of 3)]
[im 15/74  bone]
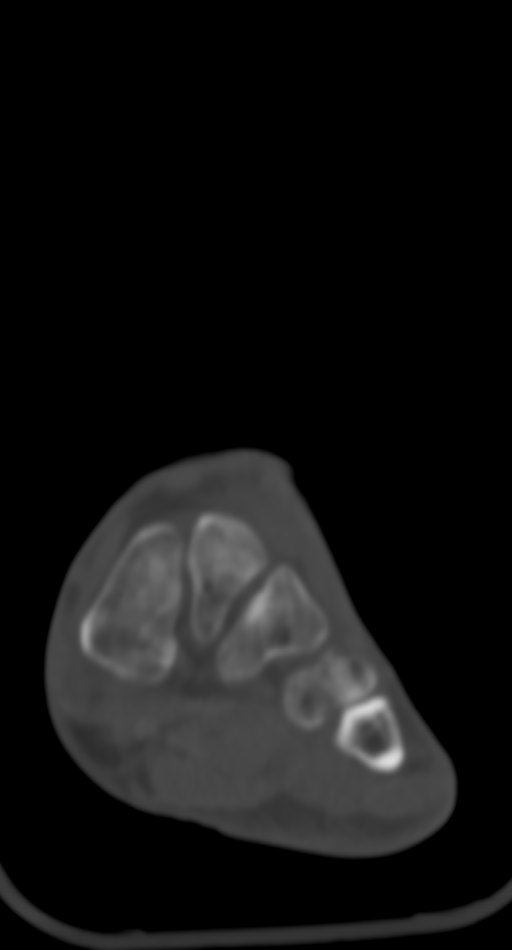
[im 30/74  bone]
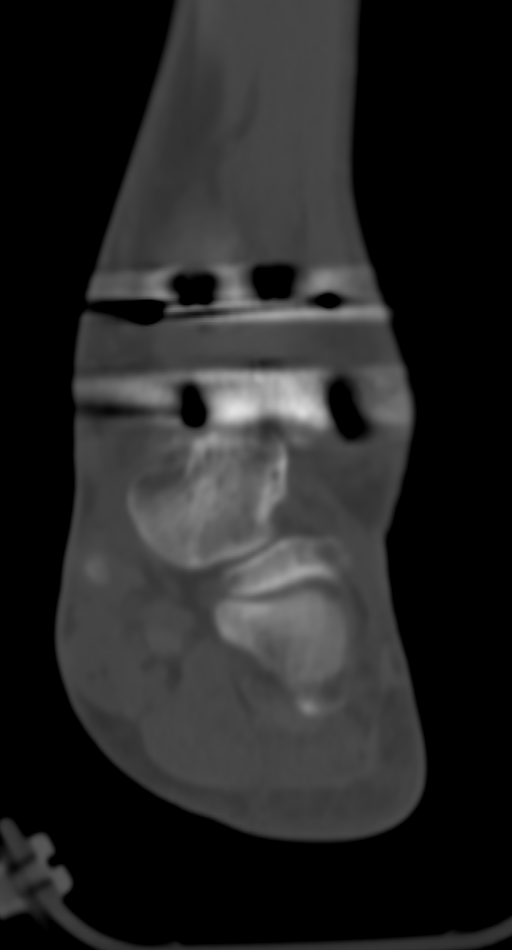
[im 44/74  bone]
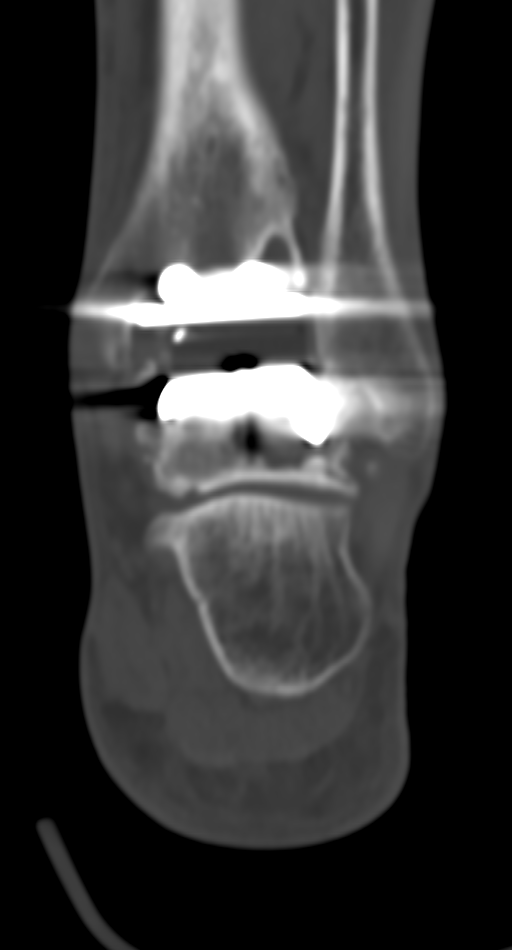

[Series 14: sfov lower extremity 2.00 br40 s3 soft · sagittal · 0.29mm/px · 5 of 45 slices shown, 6 images (3 of 3)]
[im 15/45  bone]
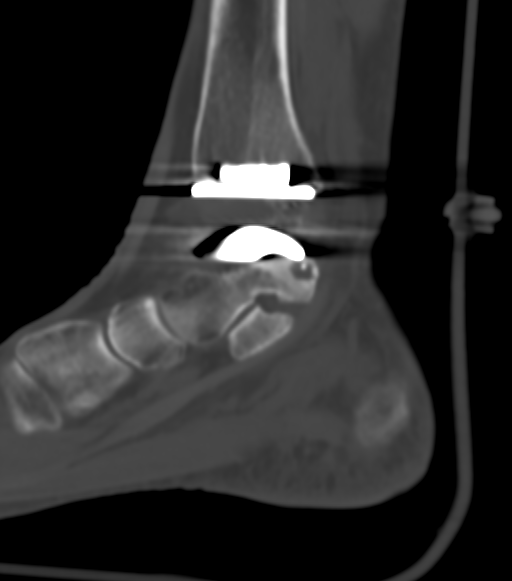
[im 19/45  bone]
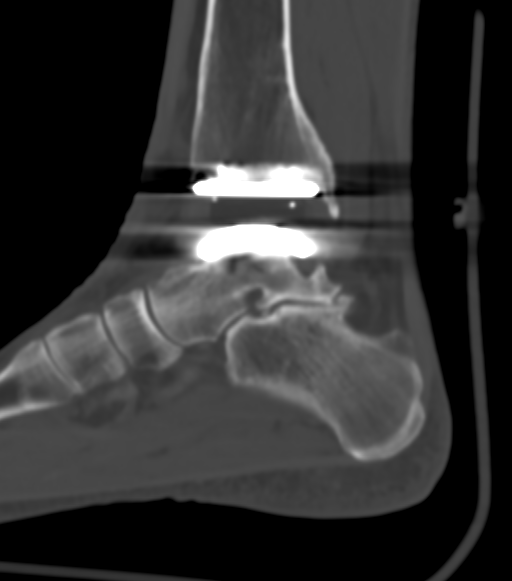
[im 23/45  soft-tissue]
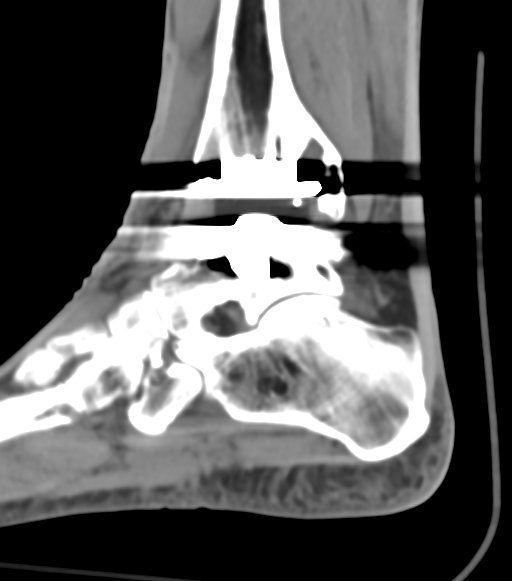
[im 23/45  bone]
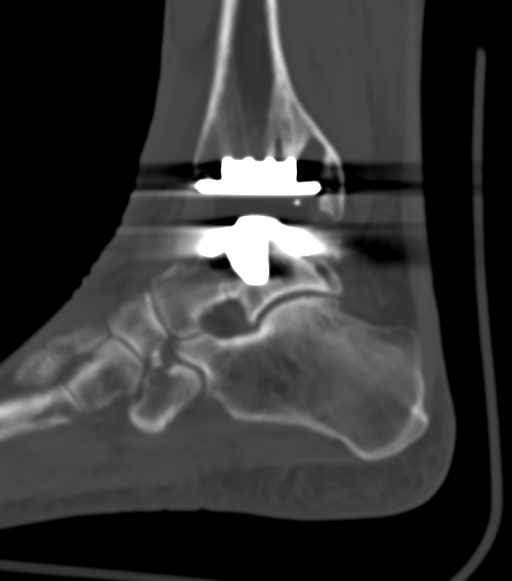
[im 26/45  bone]
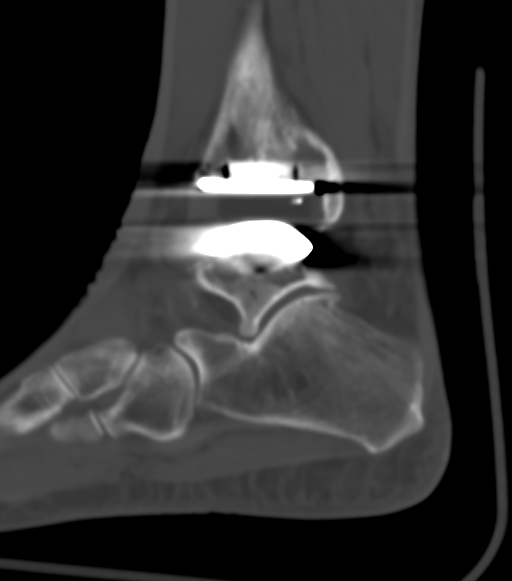
[im 30/45  bone]
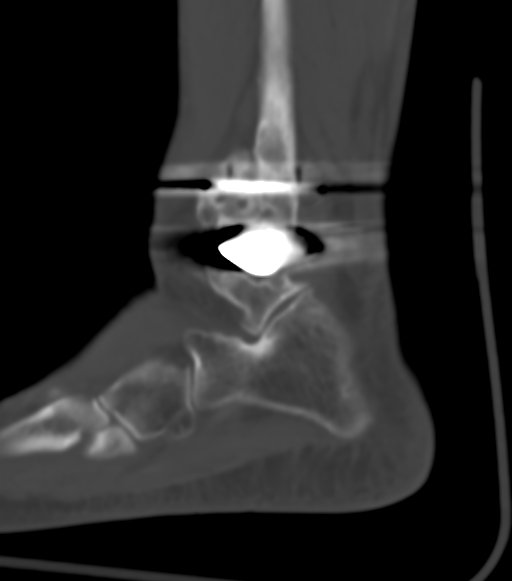

[12 of 33 positions shown; findings below may reference images not displayed]

FINDINGS: Bones/Joint/Cartilage

Status post ankle arthroplasty. The hardware alignment is
anatomical. There is heterotopic ossification about the distal tibia
and talus. There are multiple lucencies about the tibial and talar
components. No acute perihardware fracture, evaluation is however
limited due to streak artifact. No appreciable joint effusion.

Ligaments

Ligaments are suboptimally evaluated by CT.

Muscles and Tendons
Muscles are normal in bulk. Tendons of the anterior, posterior and
peroneal compartments appear intact.

Soft tissue
No fluid collection or hematoma.  No soft tissue mass.
IMPRESSION: 1. Status post ankle arthroplasty with hardware in anatomic
alignment.

2.  No evidence of perihardware fracture.

3. There is heterotopic ossification and multiple lucencies about
the talar and tibial components, evaluation is limited due to streak
artifact. If there is clinical concern for hardware loosening,
follow-up radiographs would be helpful for further evaluation.

4. Muscles and tendons appear intact. No joint effusion or evidence
of myotendinous injury.

## 2023-07-13 DIAGNOSIS — Z6823 Body mass index (BMI) 23.0-23.9, adult: Secondary | ICD-10-CM | POA: Diagnosis not present

## 2023-07-13 DIAGNOSIS — Z01411 Encounter for gynecological examination (general) (routine) with abnormal findings: Secondary | ICD-10-CM | POA: Diagnosis not present

## 2023-07-13 DIAGNOSIS — Z1331 Encounter for screening for depression: Secondary | ICD-10-CM | POA: Diagnosis not present

## 2023-07-13 DIAGNOSIS — Z01419 Encounter for gynecological examination (general) (routine) without abnormal findings: Secondary | ICD-10-CM | POA: Diagnosis not present

## 2023-07-13 DIAGNOSIS — Z1231 Encounter for screening mammogram for malignant neoplasm of breast: Secondary | ICD-10-CM | POA: Diagnosis not present

## 2023-07-13 DIAGNOSIS — Z124 Encounter for screening for malignant neoplasm of cervix: Secondary | ICD-10-CM | POA: Diagnosis not present

## 2023-07-16 DIAGNOSIS — I10 Essential (primary) hypertension: Secondary | ICD-10-CM | POA: Diagnosis not present

## 2023-07-16 DIAGNOSIS — Z0000 Encounter for general adult medical examination without abnormal findings: Secondary | ICD-10-CM | POA: Diagnosis not present

## 2023-07-16 DIAGNOSIS — K219 Gastro-esophageal reflux disease without esophagitis: Secondary | ICD-10-CM | POA: Diagnosis not present

## 2023-07-16 DIAGNOSIS — M810 Age-related osteoporosis without current pathological fracture: Secondary | ICD-10-CM | POA: Diagnosis not present

## 2023-07-16 DIAGNOSIS — F39 Unspecified mood [affective] disorder: Secondary | ICD-10-CM | POA: Diagnosis not present

## 2023-07-16 DIAGNOSIS — E7800 Pure hypercholesterolemia, unspecified: Secondary | ICD-10-CM | POA: Diagnosis not present

## 2023-07-16 DIAGNOSIS — H6122 Impacted cerumen, left ear: Secondary | ICD-10-CM | POA: Diagnosis not present

## 2023-07-16 DIAGNOSIS — N951 Menopausal and female climacteric states: Secondary | ICD-10-CM | POA: Diagnosis not present

## 2023-07-16 DIAGNOSIS — E039 Hypothyroidism, unspecified: Secondary | ICD-10-CM | POA: Diagnosis not present

## 2023-07-18 DIAGNOSIS — E039 Hypothyroidism, unspecified: Secondary | ICD-10-CM | POA: Diagnosis not present

## 2023-07-18 DIAGNOSIS — I10 Essential (primary) hypertension: Secondary | ICD-10-CM | POA: Diagnosis not present

## 2023-07-18 DIAGNOSIS — E7800 Pure hypercholesterolemia, unspecified: Secondary | ICD-10-CM | POA: Diagnosis not present

## 2023-10-03 DIAGNOSIS — G4700 Insomnia, unspecified: Secondary | ICD-10-CM | POA: Diagnosis not present

## 2023-10-03 DIAGNOSIS — E039 Hypothyroidism, unspecified: Secondary | ICD-10-CM | POA: Diagnosis not present

## 2023-10-03 DIAGNOSIS — I10 Essential (primary) hypertension: Secondary | ICD-10-CM | POA: Diagnosis not present

## 2023-10-12 DIAGNOSIS — E673 Hypervitaminosis D: Secondary | ICD-10-CM | POA: Diagnosis not present

## 2023-10-12 DIAGNOSIS — M810 Age-related osteoporosis without current pathological fracture: Secondary | ICD-10-CM | POA: Diagnosis not present

## 2023-10-19 DIAGNOSIS — I10 Essential (primary) hypertension: Secondary | ICD-10-CM | POA: Diagnosis not present

## 2023-10-19 DIAGNOSIS — M810 Age-related osteoporosis without current pathological fracture: Secondary | ICD-10-CM | POA: Diagnosis not present

## 2023-10-19 DIAGNOSIS — E039 Hypothyroidism, unspecified: Secondary | ICD-10-CM | POA: Diagnosis not present

## 2023-10-19 DIAGNOSIS — E673 Hypervitaminosis D: Secondary | ICD-10-CM | POA: Diagnosis not present

## 2023-11-15 DIAGNOSIS — M810 Age-related osteoporosis without current pathological fracture: Secondary | ICD-10-CM | POA: Diagnosis not present

## 2024-02-08 DIAGNOSIS — B009 Herpesviral infection, unspecified: Secondary | ICD-10-CM | POA: Diagnosis not present

## 2024-02-08 DIAGNOSIS — F39 Unspecified mood [affective] disorder: Secondary | ICD-10-CM | POA: Diagnosis not present

## 2024-02-08 DIAGNOSIS — E039 Hypothyroidism, unspecified: Secondary | ICD-10-CM | POA: Diagnosis not present

## 2024-02-08 DIAGNOSIS — E7800 Pure hypercholesterolemia, unspecified: Secondary | ICD-10-CM | POA: Diagnosis not present

## 2024-02-08 DIAGNOSIS — I10 Essential (primary) hypertension: Secondary | ICD-10-CM | POA: Diagnosis not present

## 2024-02-08 DIAGNOSIS — M810 Age-related osteoporosis without current pathological fracture: Secondary | ICD-10-CM | POA: Diagnosis not present

## 2024-02-08 DIAGNOSIS — G4700 Insomnia, unspecified: Secondary | ICD-10-CM | POA: Diagnosis not present

## 2024-02-11 DIAGNOSIS — H16223 Keratoconjunctivitis sicca, not specified as Sjogren's, bilateral: Secondary | ICD-10-CM | POA: Diagnosis not present

## 2024-06-11 DIAGNOSIS — G4700 Insomnia, unspecified: Secondary | ICD-10-CM | POA: Diagnosis not present

## 2024-06-11 DIAGNOSIS — N951 Menopausal and female climacteric states: Secondary | ICD-10-CM | POA: Diagnosis not present

## 2024-07-18 DIAGNOSIS — G4700 Insomnia, unspecified: Secondary | ICD-10-CM | POA: Diagnosis not present

## 2024-07-18 DIAGNOSIS — B009 Herpesviral infection, unspecified: Secondary | ICD-10-CM | POA: Diagnosis not present

## 2024-07-18 DIAGNOSIS — Z1159 Encounter for screening for other viral diseases: Secondary | ICD-10-CM | POA: Diagnosis not present

## 2024-07-18 DIAGNOSIS — I10 Essential (primary) hypertension: Secondary | ICD-10-CM | POA: Diagnosis not present

## 2024-07-18 DIAGNOSIS — N951 Menopausal and female climacteric states: Secondary | ICD-10-CM | POA: Diagnosis not present

## 2024-07-18 DIAGNOSIS — Z0000 Encounter for general adult medical examination without abnormal findings: Secondary | ICD-10-CM | POA: Diagnosis not present

## 2024-07-18 DIAGNOSIS — M810 Age-related osteoporosis without current pathological fracture: Secondary | ICD-10-CM | POA: Diagnosis not present

## 2024-07-18 DIAGNOSIS — F39 Unspecified mood [affective] disorder: Secondary | ICD-10-CM | POA: Diagnosis not present

## 2024-07-18 DIAGNOSIS — E7800 Pure hypercholesterolemia, unspecified: Secondary | ICD-10-CM | POA: Diagnosis not present

## 2024-07-18 DIAGNOSIS — E039 Hypothyroidism, unspecified: Secondary | ICD-10-CM | POA: Diagnosis not present

## 2024-07-30 DIAGNOSIS — Z1331 Encounter for screening for depression: Secondary | ICD-10-CM | POA: Diagnosis not present

## 2024-07-30 DIAGNOSIS — Z01411 Encounter for gynecological examination (general) (routine) with abnormal findings: Secondary | ICD-10-CM | POA: Diagnosis not present

## 2024-07-30 DIAGNOSIS — Z1231 Encounter for screening mammogram for malignant neoplasm of breast: Secondary | ICD-10-CM | POA: Diagnosis not present

## 2024-07-30 DIAGNOSIS — Z01419 Encounter for gynecological examination (general) (routine) without abnormal findings: Secondary | ICD-10-CM | POA: Diagnosis not present

## 2024-07-30 DIAGNOSIS — Z124 Encounter for screening for malignant neoplasm of cervix: Secondary | ICD-10-CM | POA: Diagnosis not present

## 2024-07-31 DIAGNOSIS — E039 Hypothyroidism, unspecified: Secondary | ICD-10-CM | POA: Diagnosis not present

## 2024-07-31 DIAGNOSIS — M810 Age-related osteoporosis without current pathological fracture: Secondary | ICD-10-CM | POA: Diagnosis not present

## 2024-07-31 DIAGNOSIS — E673 Hypervitaminosis D: Secondary | ICD-10-CM | POA: Diagnosis not present
